# Patient Record
Sex: Male | Born: 2000 | Race: Black or African American | Hispanic: No | Marital: Single | Smoking: Never smoker
Health system: Southern US, Community
[De-identification: ages and names within clinical notes are randomized; demographics above are authoritative.]

## PROBLEM LIST (undated history)

## (undated) DIAGNOSIS — M1990 Unspecified osteoarthritis, unspecified site: Secondary | ICD-10-CM

## (undated) DIAGNOSIS — E669 Obesity, unspecified: Secondary | ICD-10-CM

## (undated) DIAGNOSIS — I10 Essential (primary) hypertension: Secondary | ICD-10-CM

---
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
## 2018-03-06 ENCOUNTER — Other Ambulatory Visit: Payer: Self-pay

## 2018-03-06 ENCOUNTER — Encounter (HOSPITAL_COMMUNITY): Payer: Self-pay

## 2018-03-06 ENCOUNTER — Ambulatory Visit (HOSPITAL_COMMUNITY)
Admission: EM | Admit: 2018-03-06 | Discharge: 2018-03-06 | Disposition: A | Discharge status: Home / Self Care | Payer: Managed Care, Other (non HMO) | Attending: Family Medicine | Admitting: Family Medicine

## 2018-03-06 DIAGNOSIS — G510 Bell's palsy: Secondary | ICD-10-CM | POA: Diagnosis not present

## 2018-03-06 HISTORY — DX: Unspecified osteoarthritis, unspecified site: M19.90

## 2018-03-06 MED ORDER — PREDNISONE 20 MG PO TABS: 60.0000 mg | ORAL_TABLET | Freq: Every day | ORAL | 0 refills | Status: AC

## 2018-03-06 NOTE — ED Triage Notes (Signed)
[facility], pt complains of left ear pain, runny nose, and numbness in face x2 days, pt has taken sudafed to treat symptoms but has no relief

## 2018-03-06 NOTE — Discharge Instructions (Signed)
History and exam concerning for Bell's palsy.  Start prednisone as directed.  Follow-up with neurology in 5 days for reevaluation and further management needed.

## 2018-03-06 NOTE — ED Provider Notes (Signed)
[facility]    CSN: [id number] Arrival date & time: 03/06/18  1408     History   Chief Complaint Chief Complaint  Patient presents with  . Sinusitis    HPI Jonathan Mooney is a 17 y.o. male.   17 year old male comes in with mother for 2-day history of left ear pain, rhinorrhea, numbness to the left side of the face.  States first started out with left ear pain about 3 days ago, thought it was due to his ear but, and removed it.  Since 2 days ago, has had rhinorrhea, facial numbness to the left side.  Denies cough, congestion, sore throat.  Denies fever, chills, night sweats.  Feels some "cramps" to the submandibular area when opening his mouth without trismus.  He still eating and drinking without problems.  Denies weakness, dizziness, syncope.  Denies ataxia, aphasia, confusion, altered mental status.  Has not tried anything for the symptoms.      Past Medical History:  Diagnosis Date  . Arthritis   . Obesity     There are no active problems to display for this patient.   History reviewed. No pertinent surgical history.     Home Medications    Prior to Admission medications   Medication Sig Start Date End Date Taking? Authorizing Provider  predniSONE (DELTASONE) 20 MG tablet Take 3 tablets (60 mg total) by mouth daily for 5 days. 03/06/18 03/11/18  Belinda FisherYu, Amy V, PA-C    Family History History reviewed. No pertinent family history.  Social History Social History   Tobacco Use  . Smoking status: Never Smoker  Substance Use Topics  . Alcohol use: No    Frequency: Never  . Drug use: No     Allergies   Patient has no known allergies.   Review of Systems Review of Systems  Reason unable to perform ROS: See HPI as above.     Physical Exam Triage Vital Signs ED Triage Vitals  Enc Vitals Group     BP 03/06/18 1447 (!) 154/86     Pulse Rate 03/06/18 1447 87     Resp 03/06/18 1447 17     Temp 03/06/18 1447 98.6 F (37 C)     Temp Source  03/06/18 1447 Oral     SpO2 03/06/18 1447 96 %     Weight --      Height --      Head Circumference --      Peak Flow --      Pain Score 03/06/18 1448 0     Pain Loc --      Pain Edu? --      Excl. in [place]? --    No data found.  Updated Vital Signs BP (!) 154/86 (BP Location: Left Arm)   Pulse 87   Temp 98.6 F (37 C) (Oral)   Resp 17   SpO2 96%   Physical Exam  Constitutional: He is oriented to person, place, and time. He appears well-developed and well-nourished. No distress.  HENT:  Head: Normocephalic and atraumatic.  Right Ear: Tympanic membrane, external ear and ear canal normal. Tympanic membrane is not erythematous and not bulging.  Left Ear: Tympanic membrane, external ear and ear canal normal. Tympanic membrane is not erythematous and not bulging.  Nose: Nose normal. Right sinus exhibits no maxillary sinus tenderness and no frontal sinus tenderness. Left sinus exhibits no maxillary sinus tenderness and no frontal sinus tenderness.  Mouth/Throat: Uvula is midline, oropharynx is clear  and moist and mucous membranes are normal. No trismus in the jaw. No tonsillar exudate.  Eyes: Pupils are equal, round, and reactive to light. Conjunctivae are normal.  Neck: Normal range of motion. Neck supple.  Cardiovascular: Normal rate, regular rhythm and normal heart sounds. Exam reveals no gallop and no friction rub.  No murmur heard. Pulmonary/Chest: Effort normal and breath sounds normal. He has no decreased breath sounds. He has no wheezes. He has no rhonchi. He has no rales.  Lymphadenopathy:    He has no cervical adenopathy.  Neurological: He is alert and oriented to person, place, and time. He is not disoriented. GCS eye subscore is 4. GCS verbal subscore is 5. GCS motor subscore is 6.  Deficit of cranial nerve VII, with inability to raise eyebrow, decreased strength of eyelid, some mouth drooping on the left side.  Cranial nerves I to VI, VIII to XII grossly intact.  Sensation  intact and equal bilaterally.  Was able to distinguish between sharp and dull.  Negative Romberg, pronator drift.  Normal finger-to-nose, heel-to-shin, rapid movement.  Normal gait and coordination, was able to ambulate on and off exam table without difficulty.  Skin: Skin is warm and dry. He is not diaphoretic.  Psychiatric: He has a normal mood and affect. His behavior is normal. Judgment normal.    UC Treatments / Results  Labs (all labs ordered are listed, but only abnormal results are displayed) Labs Reviewed - No data to display  EKG None  Radiology No results found.  Procedures Procedures (including critical care time)  Medications Ordered in UC Medications - No data to display  Initial Impression / Assessment and Plan / UC Course  I have reviewed the triage vital signs and the nursing notes.  Pertinent labs & imaging results that were available during my care of the patient were reviewed by me and considered in my medical decision making (see chart for details).    History and exam concerning for Bell's palsy.  Will start prednisone and have patient follow-up with neurology within the week.  Return precautions given.  Mother expresses understanding and agrees to plan.  Final Clinical Impressions(s) / UC Diagnoses   Final diagnoses:  Bell's palsy    ED Prescriptions    Medication Sig Dispense Auth. Provider   predniSONE (DELTASONE) 20 MG tablet Take 3 tablets (60 mg total) by mouth daily for 5 days. 15 tablet Threasa Alpha, PA-C 03/06/18 1800

## 2018-03-06 NOTE — ED Notes (Signed)
Pt discharged by provider.

## 2018-05-11 ENCOUNTER — Encounter

## 2018-05-11 ENCOUNTER — Ambulatory Visit: Payer: 59 | Admitting: Neurology

## 2018-05-11 ENCOUNTER — Encounter: Payer: Self-pay | Admitting: Neurology

## 2018-05-11 ENCOUNTER — Telehealth: Payer: Self-pay | Admitting: *Deleted

## 2018-05-11 NOTE — Telephone Encounter (Signed)
No showed new patient appointment. 

## 2018-09-28 IMAGING — MR MR KNEE*L* W/O CM
4 of 6 series · 23 of 40 positions shown · non-contrast
Comparison: Radiographs 10/13/2017

CLINICAL DATA: Patellar dislocation 3 weeks ago.

EXAM:
MRI OF THE LEFT KNEE WITHOUT CONTRAST
TECHNIQUE: Multiplanar, multisequence MR imaging of the knee was performed. No
intravenous contrast was administered.

[Series 4: pd_tse_fs_tra · axial · 4.0mm · 0.42mm/px · z∈[-72,+28]mm · 5 of 26 slices shown]
[im 1/26]
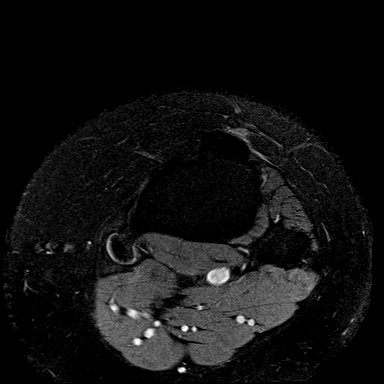
[im 4/26]
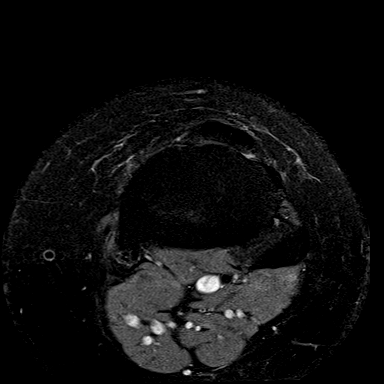
[im 7/26]
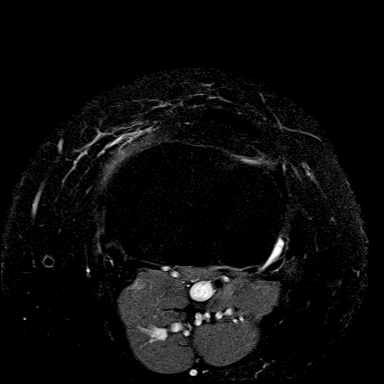
[im 13/26]
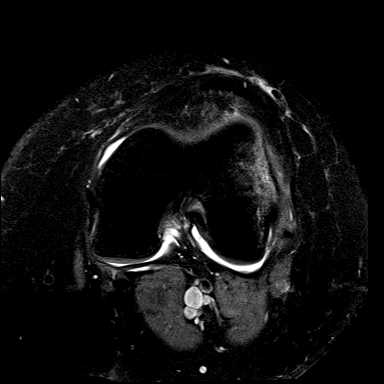
[im 22/26]
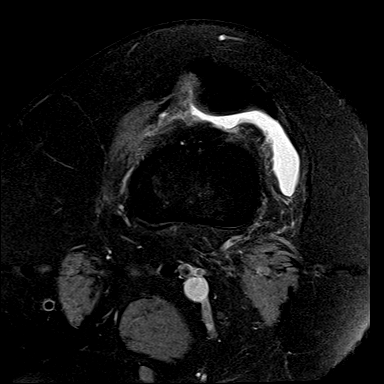

[Series 6: T2 fat-sat · coronal · 4.0mm · 0.62mm/px · 6 of 22 slices shown]
[im 1/22]
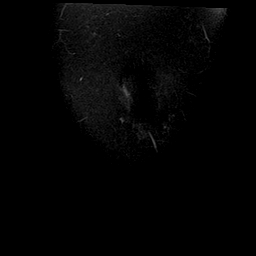
[im 5/22]
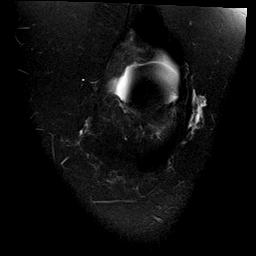
[im 9/22]
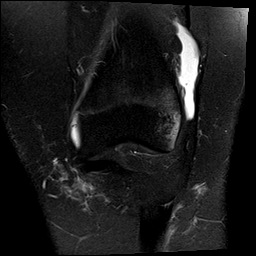
[im 13/22]
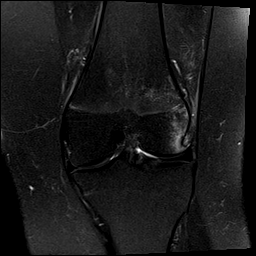
[im 17/22]
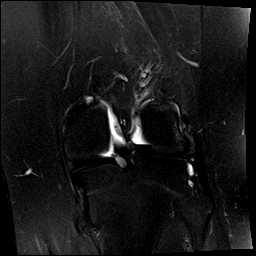
[im 22/22]
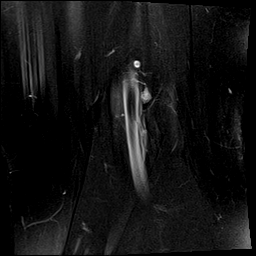

[Series 7: PD fat-sat · sagittal · 4.0mm · 0.31mm/px · 6 of 22 slices shown]
[im 1/22]
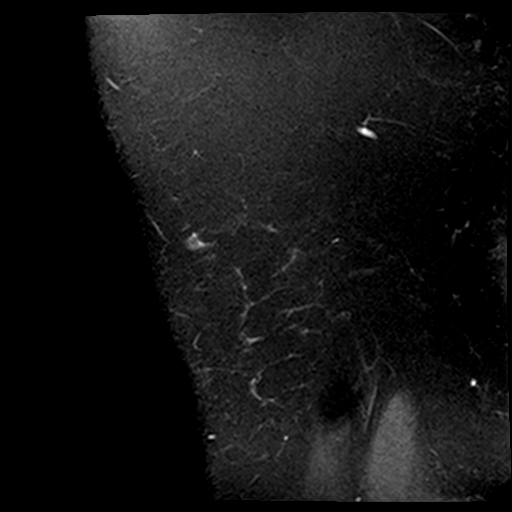
[im 5/22]
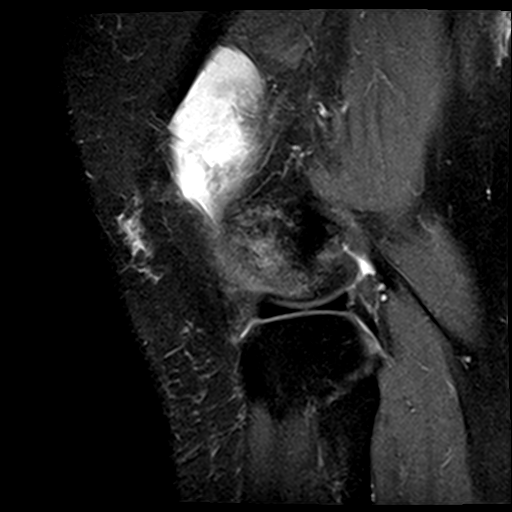
[im 9/22]
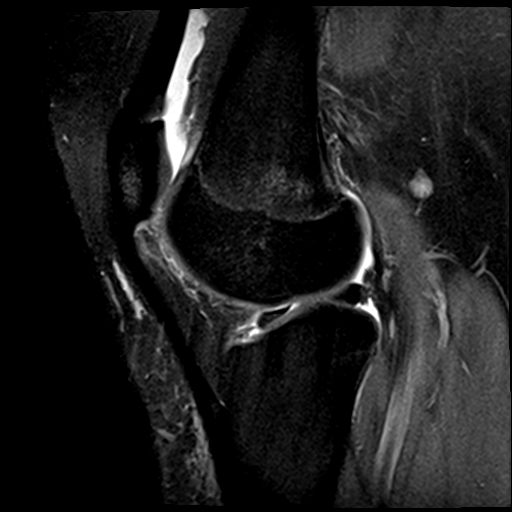
[im 13/22]
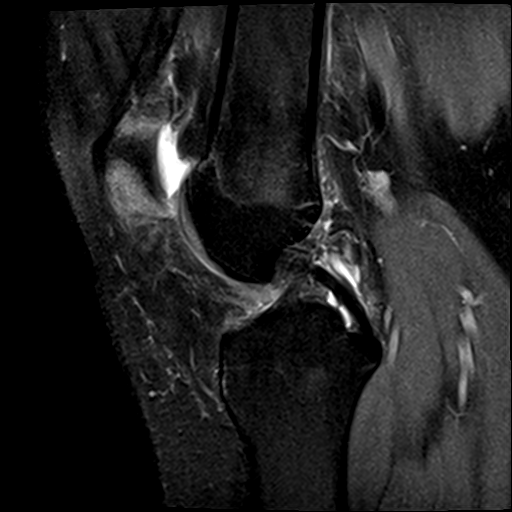
[im 17/22]
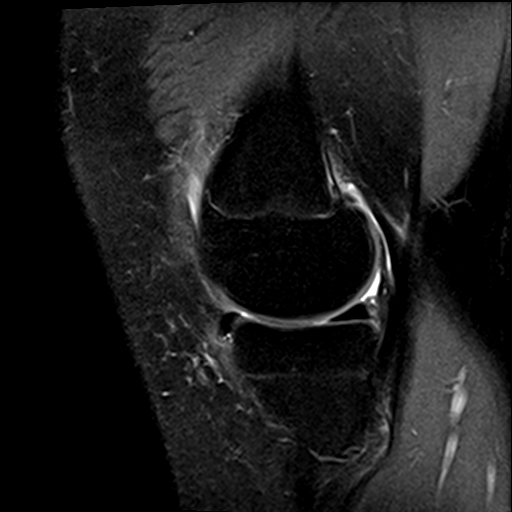
[im 22/22]
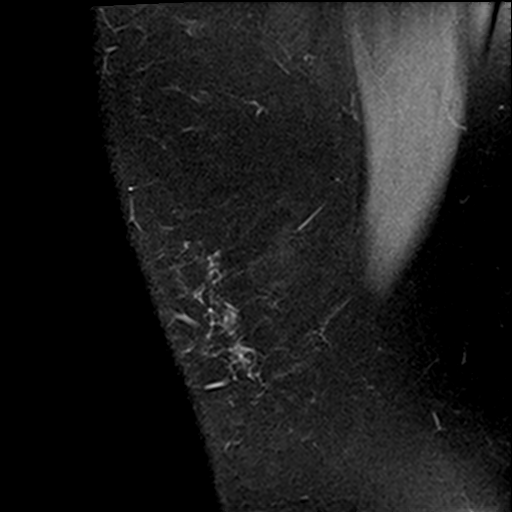

[Series 8: T1 · coronal · 4.0mm · 0.25mm/px · 6 of 22 slices shown]
[im 1/22]
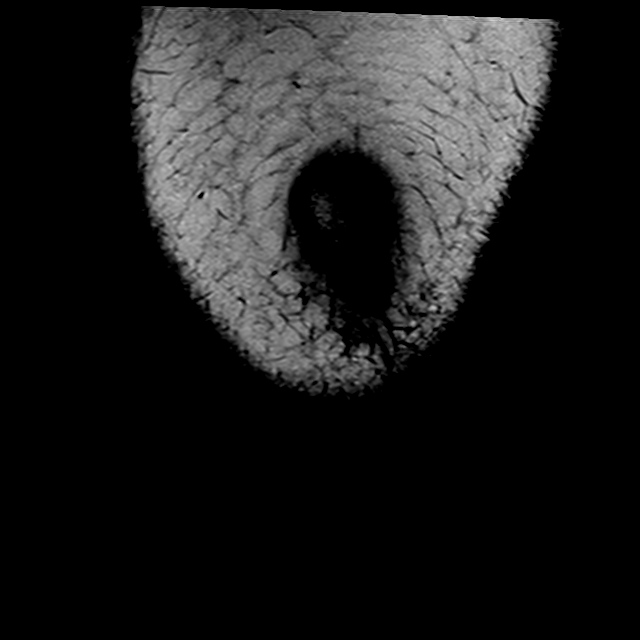
[im 5/22]
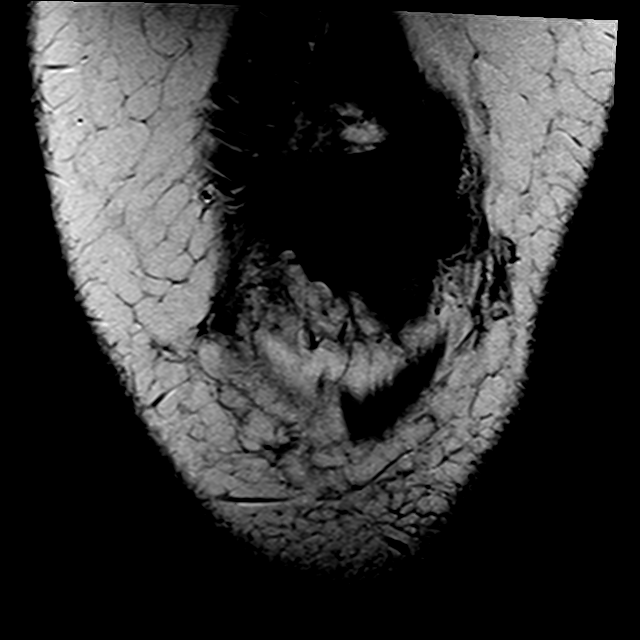
[im 9/22]
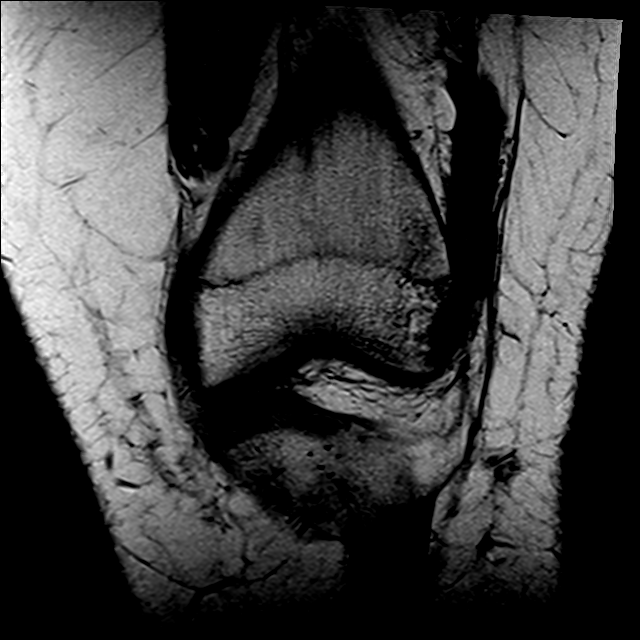
[im 13/22]
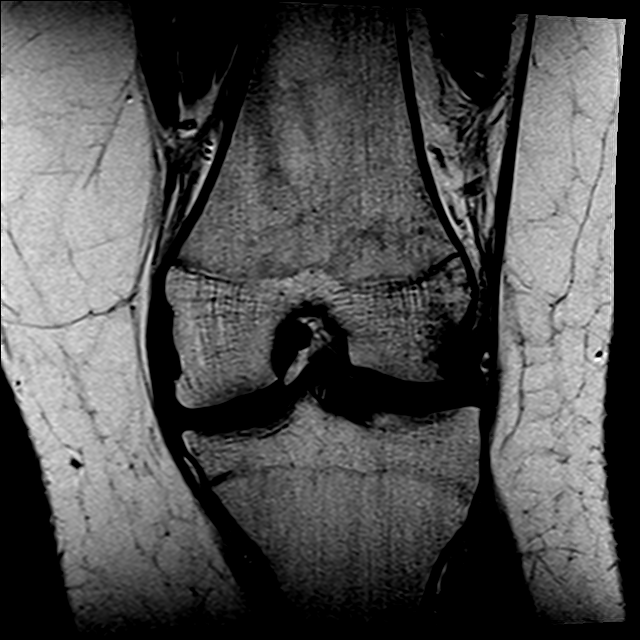
[im 17/22]
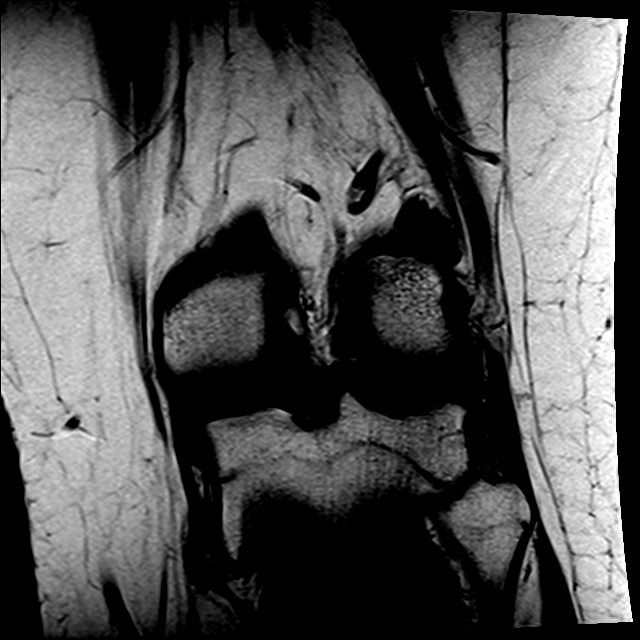
[im 22/22]
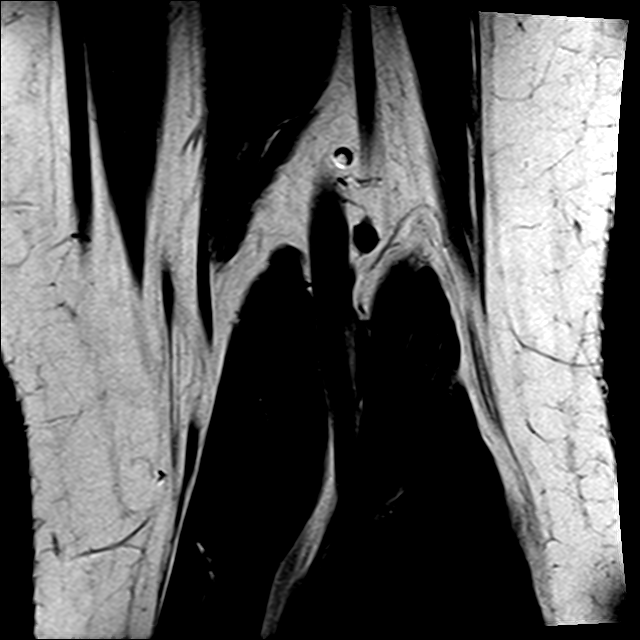

[23 of 40 positions shown; findings below may reference images not displayed]

FINDINGS: MENISCI

Medial meniscus:  Intact

Lateral meniscus:  Intact

LIGAMENTS

Cruciates:  Intact

Collaterals:  Intact

CARTILAGE

Patellofemoral: Intact articular cartilage. Chondral contusion
involving the medial facet. No delamination injury.

Medial:  Normal

Lateral:  Normal

Joint:  Moderate-sized joint effusion.

Popliteal Fossa:  No popliteal mass or Baker's cyst.

Extensor Mechanism: Findings consistent with a patellar dislocation
injury. The medial retinaculum and medial patellofemoral ligament
are torn at the patellar attachment site. The quadriceps and
patellar tendons are intact. The TT-TG distance is 16 mm.

Bones: Typical kissing bone contusions involving the medial patella
and lateral femoral condyle.

Other: Normal knee musculature.
IMPRESSION: 1. Findings consistent with a patellar dislocation with kissing
medial patellar and lateral femoral bone contusions, torn medial
retinaculum and medial patellofemoral ligament at the patellar
attachment site and mild lateral orientation of the patella in
relation to the femoral trochlear groove.
2. Intact cruciate and collateral ligaments and no meniscal tears.
3. Small joint effusion.

## 2019-05-04 ENCOUNTER — Other Ambulatory Visit: Payer: Self-pay

## 2019-05-04 ENCOUNTER — Encounter: Payer: Self-pay | Admitting: Emergency Medicine

## 2019-05-04 ENCOUNTER — Ambulatory Visit
Admission: EM | Admit: 2019-05-04 | Discharge: 2019-05-04 | Disposition: A | Discharge status: Home / Self Care | Payer: Managed Care, Other (non HMO) | Attending: Emergency Medicine | Admitting: Emergency Medicine

## 2019-05-04 DIAGNOSIS — Z20822 Contact with and (suspected) exposure to covid-19: Secondary | ICD-10-CM

## 2019-05-04 DIAGNOSIS — Z20828 Contact with and (suspected) exposure to other viral communicable diseases: Secondary | ICD-10-CM | POA: Diagnosis not present

## 2019-05-04 DIAGNOSIS — R509 Fever, unspecified: Secondary | ICD-10-CM

## 2019-05-04 NOTE — ED Provider Notes (Signed)
[facility]    CSN: [id number] Arrival date & time: 05/04/19  1754      History   Chief Complaint Chief Complaint  Patient presents with  . COVID Exposure    HPI Jonathan Mooney is a 18 y.o. male history of arthritis, obesity presenting for cover testing.  States that his mother tested positive today.  Patient had general URI symptoms about 3 weeks ago consistent with common cold.  States that he has had subjective warmness, "like a normal fever ".  Patient denies malaise, decreased appetite, loss of smell/taste, cough, shortness of breath.  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   Past Medical History:  Diagnosis Date  . Arthritis   . Obesity     There are no active problems to display for this patient.   History reviewed. No pertinent surgical history.     Home Medications    Prior to Admission medications   Not on File    Family History History reviewed. No pertinent family history.  Social History Social History   Tobacco Use  . Smoking status: Never Smoker  . Smokeless tobacco: Never Used  Substance Use Topics  . Alcohol use: No    Frequency: Never    Comment: socially  . Drug use: No     Allergies   Patient has no known allergies.   Review of Systems Review of Systems  Constitutional: Negative for fatigue and fever.  HENT: Negative for congestion, dental problem, ear pain, facial swelling, hearing loss, sinus pain, sore throat, trouble swallowing and voice change.   Eyes: Negative for photophobia, pain and visual disturbance.  Respiratory: Negative for cough and shortness of breath.   Cardiovascular: Negative for chest pain and palpitations.  Gastrointestinal: Negative for abdominal pain, diarrhea and vomiting.  Musculoskeletal: Negative for arthralgias and myalgias.  Skin: Negative for rash and wound.  Neurological: Negative for dizziness, speech difficulty and headaches.  All other systems reviewed and are negative.     Physical Exam Triage Vital Signs ED Triage Vitals  Enc Vitals Group     BP 05/04/19 1810 138/82     Pulse Rate 05/04/19 1810 95     Resp 05/04/19 1810 18     Temp 05/04/19 1810 99.1 F (37.3 C)     Temp Source 05/04/19 1810 Oral     SpO2 05/04/19 1810 98 %     Weight --      Height --      Head Circumference --      Peak Flow --      Pain Score 05/04/19 1811 0     Pain Loc --      Pain Edu? --      Excl. in [place]? --    No data found.  Updated Vital Signs BP 138/82 (BP Location: Left Wrist)   Pulse 95   Temp 99.1 F (37.3 C) (Oral)   Resp 18   SpO2 98%   Visual Acuity Right Eye Distance:   Left Eye Distance:   Bilateral Distance:    Right Eye Near:   Left Eye Near:    Bilateral Near:     Physical Exam Constitutional:      General: He is not in acute distress.    Appearance: He is obese. He is not ill-appearing.  HENT:     Head: Normocephalic and atraumatic.     Mouth/Throat:     Mouth: Mucous membranes are moist.     Pharynx: Oropharynx is  clear.  Eyes:     General: No scleral icterus.    Pupils: Pupils are equal, round, and reactive to light.  Cardiovascular:     Rate and Rhythm: Normal rate and regular rhythm.     Heart sounds: Normal heart sounds.  Pulmonary:     Effort: Pulmonary effort is normal. No respiratory distress.     Breath sounds: No wheezing.  Skin:    Coloration: Skin is not jaundiced or pale.  Neurological:     Mental Status: He is alert and oriented to person, place, and time.      UC Treatments / Results  Labs (all labs ordered are listed, but only abnormal results are displayed) Labs Reviewed - No data to display  EKG   Radiology No results found.  Procedures Procedures (including critical care time)  Medications Ordered in UC Medications - No data to display  Initial Impression / Assessment and Plan / UC Course  I have reviewed the triage vital signs and the nursing notes.  Pertinent labs & imaging results that  were available during my care of the patient were reviewed by me and considered in my medical decision making (see chart for details).     1.  Exposure to COVID Covid test pending, work note provided.  Return precautions discussed, patient verbalized understanding and is agreeable to plan.  Final Clinical Impressions(s) / UC Diagnoses   Final diagnoses:  Close Exposure to Covid-19 Virus  Elevated temperature     Discharge Instructions     Your COVID test is pending: Is important you quarantine at home until your results are back. You may take OTC Tylenol for fever and myalgias.  It is important to drink plenty of water throughout the day to stay hydrated. If you test positive and later develop severe fever, cough, or shortness of breath, it is recommended that you go to the ER for further evaluation.    ED Prescriptions    None     Controlled Substance Prescriptions [place] Controlled Substance Registry consulted? Not Applicable   Shea EvansHall-Potvin, , [place]PA-C 05/04/19 1830

## 2019-05-04 NOTE — ED Notes (Signed)
Patient able to ambulate independently  

## 2019-05-04 NOTE — ED Triage Notes (Signed)
Pt presents to [facility] for assessment after his mom tested positive for COVID 3 days ago.  Pt denies any symptoms at this time but states he felt like he had a cold a few days ago.  Patient lives with his mother.

## 2019-05-04 NOTE — Discharge Instructions (Addendum)
Your COVID test is pending: Is important you quarantine at home until your results are back. °You may take OTC Tylenol for fever and myalgias.  It is important to drink plenty of water throughout the day to stay hydrated. °If you test positive and later develop severe fever, cough, or shortness of breath, it is recommended that you go to the ER for further evaluation. °

## 2019-05-06 LAB — NOVEL CORONAVIRUS, NAA: SARS-CoV-2, NAA: NOT DETECTED

## 2019-07-20 ENCOUNTER — Ambulatory Visit
Admission: EM | Admit: 2019-07-20 | Discharge: 2019-07-20 | Disposition: A | Discharge status: Home / Self Care | Payer: Managed Care, Other (non HMO) | Attending: Physician Assistant | Admitting: Physician Assistant

## 2019-07-20 DIAGNOSIS — L600 Ingrowing nail: Secondary | ICD-10-CM

## 2019-07-20 MED ORDER — CEPHALEXIN 500 MG PO CAPS: 500.0000 mg | ORAL_CAPSULE | Freq: Four times a day (QID) | ORAL | 0 refills | Status: DC

## 2019-07-20 NOTE — Discharge Instructions (Signed)
Start keflex as directed. Epsom salt soaks at least 15 mins once a day. Avoid tight shoes. Follow up with podiatrist for further treatment if needed.

## 2019-07-20 NOTE — ED Triage Notes (Signed)
Pt c/o lt big toe pain x2 days, denies injury

## 2019-07-20 NOTE — ED Provider Notes (Signed)
[facility]    CSN: [id number] Arrival date & time: 07/20/19  1325      History   Chief Complaint Chief Complaint  Patient presents with   Toe Pain    HPI Jonathan Mooney is a 18 y.o. male.   18 year old male comes in for 2 day history of left great toe pain. Denies injury/trauma. Pain is around the medial nailbed. Recently started wearing new shoes. Denies tightness. Area with swelling, redness and pain. Denies spreading redness. Denies purulent drainage. Has not tried anything for the symptoms.      Past Medical History:  Diagnosis Date   Arthritis    Obesity     There are no active problems to display for this patient.   History reviewed. No pertinent surgical history.     Home Medications    Prior to Admission medications   Medication Sig Start Date End Date Taking? Authorizing Provider  cephALEXin (KEFLEX) 500 MG capsule Take 1 capsule (500 mg total) by mouth 4 (four) times daily. 07/20/19   Ok Edwards, PA-C    Family History No family history on file.  Social History Social History   Tobacco Use   Smoking status: Never Smoker   Smokeless tobacco: Never Used  Substance Use Topics   Alcohol use: No    Frequency: Never    Comment: socially   Drug use: No     Allergies   Patient has no known allergies.   Review of Systems Review of Systems  Reason unable to perform ROS: See HPI as above.     Physical Exam Triage Vital Signs ED Triage Vitals  Enc Vitals Group     BP 07/20/19 1400 136/74     Pulse Rate 07/20/19 1400 81     Resp 07/20/19 1400 16     Temp 07/20/19 1400 98.2 F (36.8 C)     Temp Source 07/20/19 1400 Oral     SpO2 07/20/19 1400 97 %     Weight --      Height --      Head Circumference --      Peak Flow --      Pain Score 07/20/19 1421 0     Pain Loc --      Pain Edu? --      Excl. in [place]? --    No data found.  Updated Vital Signs BP 136/74 (BP Location: Right Arm)    Pulse 81    Temp 98.2 F  (36.8 C) (Oral)    Resp 16    SpO2 97%   Physical Exam Constitutional:      General: He is not in acute distress.    Appearance: He is well-developed. He is not diaphoretic.  HENT:     Head: Normocephalic and atraumatic.  Eyes:     Conjunctiva/sclera: Conjunctivae normal.     Pupils: Pupils are equal, round, and reactive to light.  Pulmonary:     Effort: Pulmonary effort is normal. No respiratory distress.  Musculoskeletal:     Comments: Swelling to medial nailbed of left great toe. Erythema with minimal warmth. Area tender to palpation. No fluctuance felt. No paronychia on exam. Full ROM of toe. Strength normal. NVI.   Neurological:     Mental Status: He is alert and oriented to person, place, and time.      UC Treatments / Results  Labs (all labs ordered are listed, but only abnormal results are displayed) Labs Reviewed -  No data to display  EKG   Radiology No results found.  Procedures Procedures (including critical care time)  Medications Ordered in UC Medications - No data to display  Initial Impression / Assessment and Plan / UC Course  I have reviewed the triage vital signs and the nursing notes.  Pertinent labs & imaging results that were available during my care of the patient were reviewed by me and considered in my medical decision making (see chart for details).    Will start patient on Keflex to cover for surrounding cellulitis.  Epson salt soaks.  Avoid tight shoes.  Patient to follow-up here with podiatrist for further evaluation if symptoms not improving.  Final Clinical Impressions(s) / UC Diagnoses   Final diagnoses:  Ingrown nail of great toe of left foot   ED Prescriptions    Medication Sig Dispense Auth. Provider   cephALEXin (KEFLEX) 500 MG capsule Take 1 capsule (500 mg total) by mouth 4 (four) times daily. 28 capsule Belinda Fisher, PA-C     PDMP not reviewed this encounter.   Belinda Fisher, PA-C 07/20/19 1652

## 2019-11-10 ENCOUNTER — Encounter: Payer: Self-pay | Admitting: Internal Medicine

## 2019-11-10 ENCOUNTER — Ambulatory Visit (INDEPENDENT_AMBULATORY_CARE_PROVIDER_SITE_OTHER): Payer: Managed Care, Other (non HMO) | Admitting: Internal Medicine

## 2019-11-10 DIAGNOSIS — G8929 Other chronic pain: Secondary | ICD-10-CM | POA: Diagnosis not present

## 2019-11-10 DIAGNOSIS — Z7689 Persons encountering health services in other specified circumstances: Secondary | ICD-10-CM | POA: Diagnosis not present

## 2019-11-10 DIAGNOSIS — M25562 Pain in left knee: Secondary | ICD-10-CM

## 2019-11-10 NOTE — Progress Notes (Signed)
Virtual Visit via Telephone Note  I connected with Jonathan Mooney, on 11/10/2019 at 10:33 AM by telephone due to the COVID-19 pandemic and verified that I am speaking with the correct person using two identifiers.   Consent: I discussed the limitations, risks, security and privacy concerns of performing an evaluation and management service by telephone and the availability of in person appointments. I also discussed with the patient that there may be a patient responsible charge related to this service. The patient expressed understanding and agreed to proceed.   Location of Patient: Home   Location of Provider: Clinic    Persons participating in Telemedicine visit: Ledon Weihe [employer] Dr. Earlene Plater      History of Present Illness: Patient has a visit to establish care. No concerns. No significant PMH. Takes no medications. Was previously receiving care at [facility].   Reports he dislocated his patella in 2019. He is still having residual pain and requests referral to PT.    Past Medical History:  Diagnosis Date  . Arthritis   . Obesity    No Known Allergies  No current outpatient medications on file prior to visit.   No current facility-administered medications on file prior to visit.    Observations/Objective: NAD. Speaking clearly.  Work of breathing normal.  Alert and oriented. Mood appropriate.   Assessment and Plan: 1. Encounter to establish care  2. Chronic pain of left knee - Ambulatory referral to Physical Therapy   Follow Up Instructions: Return for an annual exam    I discussed the assessment and treatment plan with the patient. The patient was provided an opportunity to ask questions and all were answered. The patient agreed with the plan and demonstrated an understanding of the instructions.   The patient was advised to call back or seek an in-person evaluation if the symptoms worsen or if the condition fails to improve  as anticipated.     I provided 10 minutes total of non-face-to-face time during this encounter including median intraservice time, reviewing previous notes, investigations, ordering medications, medical decision making, coordinating care and patient verbalized understanding at the end of the visit.    Marcy Siren, D.O. Primary Care at [facility]  11/10/2019, 10:33 AM

## 2019-11-28 ENCOUNTER — Other Ambulatory Visit: Payer: Self-pay

## 2019-11-28 ENCOUNTER — Ambulatory Visit
Admission: EM | Admit: 2019-11-28 | Discharge: 2019-11-28 | Disposition: A | Discharge status: Home / Self Care | Payer: Managed Care, Other (non HMO) | Attending: Physician Assistant | Admitting: Physician Assistant

## 2019-11-28 DIAGNOSIS — Z20822 Contact with and (suspected) exposure to covid-19: Secondary | ICD-10-CM | POA: Diagnosis not present

## 2019-11-28 DIAGNOSIS — R05 Cough: Secondary | ICD-10-CM

## 2019-11-28 DIAGNOSIS — R0981 Nasal congestion: Secondary | ICD-10-CM | POA: Diagnosis not present

## 2019-11-28 DIAGNOSIS — R059 Cough, unspecified: Secondary | ICD-10-CM

## 2019-11-28 DIAGNOSIS — J3489 Other specified disorders of nose and nasal sinuses: Secondary | ICD-10-CM

## 2019-11-28 MED ORDER — IPRATROPIUM BROMIDE 0.06 % NA SOLN: 2.0000 | Freq: Four times a day (QID) | NASAL | 0 refills | Status: DC

## 2019-11-28 MED ORDER — ALBUTEROL SULFATE HFA 108 (90 BASE) MCG/ACT IN AERS: 1.0000 | INHALATION_SPRAY | Freq: Four times a day (QID) | RESPIRATORY_TRACT | 0 refills | Status: DC | PRN

## 2019-11-28 NOTE — ED Provider Notes (Signed)
[facility]    CSN: [id number] Arrival date & time: 11/28/19  0911      History   Chief Complaint Chief Complaint  Patient presents with  . Cough    HPI Jonathan Mooney is a 19 y.o. male.   19 year old male comes in for COVID testing after getting cough and headache when Jonathan Mooney went into work. States temp was 100.4 at work from heat lamp. States yesterday had some rhinorrhea, nasal congestion. Also noticed 2 day history of shortness of breath that is intermittent where Jonathan Mooney states "it feels like I worked out, but I didn't". Denies decrease in activity due to shortness of breath. Denies abdominal pain, nausea, vomiting, diarrhea. Denies loss of taste/smell. Never smoker. Inhaler use as a child for asthma/exercise. No obvious sick/COVID contact.      Past Medical History:  Diagnosis Date  . Obesity     There are no problems to display for this patient.   History reviewed. No pertinent surgical history.     Home Medications    Prior to Admission medications   Medication Sig Start Date End Date Taking? Authorizing Provider  albuterol (VENTOLIN HFA) 108 (90 Base) MCG/ACT inhaler Inhale 1-2 puffs into the lungs every 6 (six) hours as needed for wheezing or shortness of breath. 11/28/19   Tasia Catchings, Hamdan Toscano V, PA-C  ipratropium (ATROVENT) 0.06 % nasal spray Place 2 sprays into both nostrils 4 (four) times daily. 11/28/19   Ok Edwards, PA-C    Family History History reviewed. No pertinent family history.  Social History Social History   Tobacco Use  . Smoking status: Never Smoker  . Smokeless tobacco: Never Used  Substance Use Topics  . Alcohol use: No    Comment: socially  . Drug use: No     Allergies   Patient has no known allergies.   Review of Systems Review of Systems  Reason unable to perform ROS: See HPI as above.     Physical Exam Triage Vital Signs ED Triage Vitals [11/28/19 0929]  Enc Vitals Group     BP (!) 143/89     Pulse Rate 89     Resp  16     Temp 98.3 F (36.8 C)     Temp Source Oral     SpO2 97 %     Weight      Height      Head Circumference      Peak Flow      Pain Score 2     Pain Loc      Pain Edu?      Excl. in [place]?    No data found.  Updated Vital Signs BP (!) 143/89 (BP Location: Left Arm)   Pulse 89   Temp 98.3 F (36.8 C) (Oral)   Resp 16   SpO2 97%   Physical Exam Constitutional:      General: Jonathan Mooney is not in acute distress.    Appearance: Normal appearance. Jonathan Mooney is not ill-appearing, toxic-appearing or diaphoretic.  HENT:     Head: Normocephalic and atraumatic.     Mouth/Throat:     Mouth: Mucous membranes are moist.     Pharynx: Oropharynx is clear. Uvula midline.  Cardiovascular:     Rate and Rhythm: Normal rate and regular rhythm.     Heart sounds: Normal heart sounds. No murmur. No friction rub. No gallop.   Pulmonary:     Effort: Pulmonary effort is normal. No accessory muscle usage,  prolonged expiration, respiratory distress or retractions.     Comments: Lungs clear to auscultation without adventitious lung sounds. Musculoskeletal:     Cervical back: Normal range of motion and neck supple.  Skin:    General: Skin is warm and dry.  Neurological:     General: No focal deficit present.     Mental Status: Jonathan Mooney is alert and oriented to person, place, and time.     UC Treatments / Results  Labs (all labs ordered are listed, but only abnormal results are displayed) Labs Reviewed  NOVEL CORONAVIRUS, NAA    EKG   Radiology No results found.  Procedures Procedures (including critical care time)  Medications Ordered in UC Medications - No data to display  Initial Impression / Assessment and Plan / UC Course  I have reviewed the triage vital signs and the nursing notes.  Pertinent labs & imaging results that were available during my care of the patient were reviewed by me and considered in my medical decision making (see chart for details).    COVID PCR test ordered. Patient  to quarantine until testing results return. No alarming signs on exam.  Patient speaking in full sentences without respiratory distress. Discussed given symptom onset <24 hours, may need retesting if symptoms worsen or change. Symptomatic treatment discussed.  Push fluids.  Return precautions given.  Patient expresses understanding and agrees to plan.  Final Clinical Impressions(s) / UC Diagnoses   Final diagnoses:  Cough  Rhinorrhea  Nasal congestion   ED Prescriptions    Medication Sig Dispense Auth. Provider   albuterol (VENTOLIN HFA) 108 (90 Base) MCG/ACT inhaler Inhale 1-2 puffs into the lungs every 6 (six) hours as needed for wheezing or shortness of breath. 8 g Epsie Walthall V, PA-C   ipratropium (ATROVENT) 0.06 % nasal spray Place 2 sprays into both nostrils 4 (four) times daily. 15 mL Belinda Fisher, PA-C     PDMP not reviewed this encounter.   Belinda Fisher, PA-C 11/28/19 [phone]

## 2019-11-28 NOTE — Discharge Instructions (Addendum)
COVID PCR testing ordered. I would like you to quarantine until testing results. Atrovent for nasal drainage. You can try albuterol for shortness of breath. Tylenol/motrin for pain and fever. Keep hydrated, urine should be clear to pale yellow in color. If experiencing worsening shortness of breath, trouble breathing, go to the emergency department for further evaluation needed.   As discussed, given symptoms started today, if you have worsening or more symptoms, may need retesting if results come back negative.

## 2019-11-28 NOTE — ED Triage Notes (Signed)
Pt requesting COVID test for work. States went in to work at [employer], started coughing with a slight headache. States at work his temp was 100.4.

## 2019-11-29 LAB — NOVEL CORONAVIRUS, NAA: SARS-CoV-2, NAA: NOT DETECTED

## 2019-12-25 ENCOUNTER — Telehealth: Payer: Self-pay

## 2019-12-25 ENCOUNTER — Encounter: Payer: Self-pay | Admitting: Internal Medicine

## 2019-12-25 NOTE — Patient Instructions (Signed)

## 2019-12-25 NOTE — Telephone Encounter (Signed)
Called patient to do their pre-visit COVID screening.  Call went to voicemail. Unable to do prescreening.  

## 2019-12-26 ENCOUNTER — Other Ambulatory Visit: Payer: Self-pay

## 2019-12-26 ENCOUNTER — Encounter: Payer: Self-pay | Admitting: Internal Medicine

## 2019-12-26 ENCOUNTER — Ambulatory Visit (INDEPENDENT_AMBULATORY_CARE_PROVIDER_SITE_OTHER): Payer: Managed Care, Other (non HMO) | Admitting: Internal Medicine

## 2019-12-26 VITALS — BP 120/83 | HR 94 | Temp 97.3°F | Resp 18 | Ht 66.0 in | Wt 356.0 lb

## 2019-12-26 DIAGNOSIS — Z13228 Encounter for screening for other metabolic disorders: Secondary | ICD-10-CM

## 2019-12-26 DIAGNOSIS — Z131 Encounter for screening for diabetes mellitus: Secondary | ICD-10-CM

## 2019-12-26 DIAGNOSIS — Z0000 Encounter for general adult medical examination without abnormal findings: Secondary | ICD-10-CM

## 2019-12-26 NOTE — Progress Notes (Signed)
  Subjective:    VALDEMAR MCCLENAHAN - 19 y.o. male MRN [id number]  Date of birth: [birth year]  HPI  MONROE QIN is here for annual exam. Patient has no concerns today. Declines STD testing.      Health Maintenance:  Health Maintenance Due  Topic Date Due  . HIV Screening  Never done  . TETANUS/TDAP  Never done    -  reports that he has never smoked. He has never used smokeless tobacco. - Review of Systems: Per HPI. - Past Medical History: There are no problems to display for this patient.  - Medications: reviewed and updated   Objective:   Physical Exam BP 120/83   Pulse 94   Temp (!) 97.3 F (36.3 C) (Temporal)   Resp 18   Ht 5\' 6"  (1.676 m)   Wt (!) 356 lb (161.5 kg)   SpO2 96%   BMI 57.46 kg/m  Physical Exam  Constitutional: He is oriented to person, place, and time and well-developed, well-nourished, and in no distress.  HENT:  Head: Normocephalic and atraumatic.  Mouth/Throat: Oropharynx is clear and moist.  Eyes: Pupils are equal, round, and reactive to light. Conjunctivae and EOM are normal.  Neck: No thyromegaly present.  Cardiovascular: Normal rate, regular rhythm, normal heart sounds and intact distal pulses.  No murmur heard. Pulmonary/Chest: Effort normal and breath sounds normal. No respiratory distress. He has no wheezes.  Abdominal: Soft. Bowel sounds are normal. He exhibits no distension. There is no abdominal tenderness. There is no rebound and no guarding.  Musculoskeletal:        General: No deformity or edema. Normal range of motion.     Cervical back: Normal range of motion and neck supple.  Lymphadenopathy:    He has no cervical adenopathy.  Neurological: He is alert and oriented to person, place, and time. Gait normal.  Skin: Skin is warm and dry. No rash noted. He is not diaphoretic.  Psychiatric: Mood, affect and judgment normal.      Assessment & Plan:    1. Annual physical exam Counseled on 150 minutes of exercise per week,  healthy eating (including decreased daily intake of saturated fats, cholesterol, added sugars, sodium), STI prevention, routine healthcare maintenance. - CBC with Differential - Comprehensive metabolic panel - Lipid Panel  2. Screening for diabetes mellitus (DM) - Hemoglobin A1c  3. Screening for metabolic disorder - Lipid Panel     , D.O. 12/26/2019, 8:48 AM Primary Care at [facility]

## 2019-12-27 LAB — CBC WITH DIFFERENTIAL/PLATELET
Basophils Absolute: 0.1 10*3/uL (ref 0.0–0.2)
Basos: 1 %
EOS (ABSOLUTE): 0.1 10*3/uL (ref 0.0–0.4)
Eos: 1 %
Hematocrit: 43.8 % (ref 37.5–51.0)
Hemoglobin: 14.9 g/dL (ref 13.0–17.7)
Immature Grans (Abs): 0 10*3/uL (ref 0.0–0.1)
Immature Granulocytes: 0 %
Lymphocytes Absolute: 3.7 10*3/uL — ABNORMAL HIGH (ref 0.7–3.1)
Lymphs: 31 %
MCH: 30 pg (ref 26.6–33.0)
MCHC: 34 g/dL (ref 31.5–35.7)
MCV: 88 fL (ref 79–97)
Monocytes Absolute: 1.1 10*3/uL — ABNORMAL HIGH (ref 0.1–0.9)
Monocytes: 10 %
Neutrophils Absolute: 6.8 10*3/uL (ref 1.4–7.0)
Neutrophils: 57 %
Platelets: 258 10*3/uL (ref 150–450)
RBC: 4.97 x10E6/uL (ref 4.14–5.80)
RDW: 13.3 % (ref 11.6–15.4)
WBC: 11.8 10*3/uL — ABNORMAL HIGH (ref 3.4–10.8)

## 2019-12-27 LAB — COMPREHENSIVE METABOLIC PANEL
ALT: 31 IU/L (ref 0–44)
AST: 22 IU/L (ref 0–40)
Albumin/Globulin Ratio: 1.9 (ref 1.2–2.2)
Albumin: 4.4 g/dL (ref 4.1–5.2)
Alkaline Phosphatase: 133 IU/L — ABNORMAL HIGH (ref 39–117)
BUN/Creatinine Ratio: 12 (ref 9–20)
BUN: 9 mg/dL (ref 6–20)
Bilirubin Total: 0.4 mg/dL (ref 0.0–1.2)
CO2: 23 mmol/L (ref 20–29)
Calcium: 9.5 mg/dL (ref 8.7–10.2)
Chloride: 104 mmol/L (ref 96–106)
Creatinine, Ser: 0.74 mg/dL — ABNORMAL LOW (ref 0.76–1.27)
GFR calc Af Amer: 154 mL/min/{1.73_m2} (ref >59)
GFR calc non Af Amer: 133 mL/min/{1.73_m2} (ref >59)
Globulin, Total: 2.3 g/dL (ref 1.5–4.5)
Glucose: 92 mg/dL (ref 65–99)
Potassium: 4.3 mmol/L (ref 3.5–5.2)
Sodium: 140 mmol/L (ref 134–144)
Total Protein: 6.7 g/dL (ref 6.0–8.5)

## 2019-12-27 LAB — LIPID PANEL
Chol/HDL Ratio: 5.8 ratio — ABNORMAL HIGH (ref 0.0–5.0)
Cholesterol, Total: 225 mg/dL — ABNORMAL HIGH (ref 100–199)
HDL: 39 mg/dL — ABNORMAL LOW (ref >39)
LDL Chol Calc (NIH): 163 mg/dL — ABNORMAL HIGH (ref 0–99)
Triglycerides: 126 mg/dL (ref 0–149)
VLDL Cholesterol Cal: 23 mg/dL (ref 5–40)

## 2019-12-27 LAB — HEMOGLOBIN A1C
Est. average glucose Bld gHb Est-mCnc: 114 mg/dL
Hgb A1c MFr Bld: 5.6 % (ref 4.8–5.6)

## 2020-01-08 NOTE — Progress Notes (Signed)
Patient notified of results & recommendations. Expressed understanding.

## 2020-04-30 ENCOUNTER — Ambulatory Visit
Admission: EM | Admit: 2020-04-30 | Discharge: 2020-04-30 | Disposition: A | Discharge status: Home / Self Care | Payer: Managed Care, Other (non HMO) | Attending: Physician Assistant | Admitting: Physician Assistant

## 2020-04-30 DIAGNOSIS — Z1152 Encounter for screening for COVID-19: Secondary | ICD-10-CM | POA: Diagnosis not present

## 2020-04-30 NOTE — ED Triage Notes (Signed)
Pt states needs covid testing for work d/t positive exposure to the company not pt directly. States is covid vaccinated. Denies s/sx's.

## 2020-04-30 NOTE — Discharge Instructions (Signed)

## 2020-05-01 LAB — SARS-COV-2, NAA 2 DAY TAT

## 2020-05-01 LAB — NOVEL CORONAVIRUS, NAA: SARS-CoV-2, NAA: NOT DETECTED

## 2020-06-25 ENCOUNTER — Telehealth (INDEPENDENT_AMBULATORY_CARE_PROVIDER_SITE_OTHER): Payer: Managed Care, Other (non HMO) | Admitting: Internal Medicine

## 2020-06-25 ENCOUNTER — Other Ambulatory Visit: Payer: Self-pay

## 2020-06-25 ENCOUNTER — Encounter: Payer: Self-pay | Admitting: Internal Medicine

## 2020-06-25 DIAGNOSIS — Z6841 Body Mass Index (BMI) 40.0 and over, adult: Secondary | ICD-10-CM | POA: Diagnosis not present

## 2020-06-25 NOTE — Progress Notes (Signed)
Virtual Visit via Telephone Note  I connected with Jonathan Mooney, on 06/25/2020 at 8:38 AM by telephone due to the COVID-19 pandemic and verified that I am speaking with the correct person using two identifiers.   Consent: I discussed the limitations, risks, security and privacy concerns of performing an evaluation and management service by telephone and the availability of in person appointments. I also discussed with the patient that there may be a patient responsible charge related to this service. The patient expressed understanding and agreed to proceed.   Location of Patient: Home   Location of Provider: Clinic    Persons participating in Telemedicine visit: [place] [facility] Dr. Earlene Plater      History of Present Illness: Patient has a follow up obesity. Reports that he has started waking up early. Going to work now and says his new job requires a lot of physical activity. Walks multiple times per week. Has started cutting back on portions. Now only drinking soda 2x per day instead of multiple times throughout the day. Does not have a scale at home but was previously a 6x and is now a 4x.    Past Medical History:  Diagnosis Date   Obesity    No Known Allergies  No current outpatient medications on file prior to visit.   No current facility-administered medications on file prior to visit.    Observations/Objective: NAD. Speaking clearly.  Work of breathing normal.  Alert and oriented. Mood appropriate.   Assessment and Plan: 1. Morbid obesity with BMI of 50.0-59.9, adult (HCC) Has been able to make lifestyle changes that presumably have resulted in weight loss given change in clothing size over the past couple months. Will refer to nutritionist for additional counseling in healthy food choices and portions. Encouraged to continue active lifestyle. Discussed that weight loss will improve long term health outcomes.  - Amb ref to Medical Nutrition  Therapy-MNT   Follow Up Instructions: 3 f/u obesity    I discussed the assessment and treatment plan with the patient. The patient was provided an opportunity to ask questions and all were answered. The patient agreed with the plan and demonstrated an understanding of the instructions.   The patient was advised to call back or seek an in-person evaluation if the symptoms worsen or if the condition fails to improve as anticipated.     I provided 14 minutes total of non-face-to-face time during this encounter including median intraservice time, reviewing previous notes, investigations, ordering medications, medical decision making, coordinating care and patient verbalized understanding at the end of the visit.    Marcy Siren, D.O. Primary Care at [facility]  06/25/2020, 8:38 AM

## 2020-10-11 ENCOUNTER — Encounter: Payer: Self-pay | Admitting: Emergency Medicine

## 2020-10-11 ENCOUNTER — Ambulatory Visit
Admission: EM | Admit: 2020-10-11 | Discharge: 2020-10-11 | Disposition: A | Discharge status: Home / Self Care | Payer: Managed Care, Other (non HMO)

## 2020-10-11 DIAGNOSIS — L600 Ingrowing nail: Secondary | ICD-10-CM | POA: Diagnosis not present

## 2020-10-11 NOTE — ED Provider Notes (Signed)
[facility]    CSN: [id number] Arrival date & time: 10/11/20  0951      History   Chief Complaint Chief Complaint  Patient presents with  . Toe Injury    HPI Jonathan Mooney is a 20 y.o. male  With history as below presenting for right great toe pain and bleeding. States is been ongoing for the last few days: Denies injury. States he has history of ingrown toenail. Requesting note for work as he wears steel toe boots which aggravate it. Has been cleaning with peroxide, Vaseline. Has never seen podiatrist, nor has he had toenail removal. Denies fever.  Past Medical History:  Diagnosis Date  . Obesity     There are no problems to display for this patient.   History reviewed. No pertinent surgical history.     Home Medications    Prior to Admission medications   Not on File    Family History Family History  Problem Relation Age of Onset  . Healthy Mother   . Healthy Father     Social History Social History   Tobacco Use  . Smoking status: Never Smoker  . Smokeless tobacco: Never Used  Vaping Use  . Vaping Use: Never used  Substance Use Topics  . Alcohol use: No    Comment: socially  . Drug use: No     Allergies   Patient has no known allergies.   Review of Systems As per HPI   Physical Exam Triage Vital Signs ED Triage Vitals  Enc Vitals Group     BP 10/11/20 1025 (!) 165/88     Pulse Rate 10/11/20 1025 81     Resp 10/11/20 1025 18     Temp 10/11/20 1025 98.2 F (36.8 C)     Temp Source 10/11/20 1025 Oral     SpO2 10/11/20 1025 97 %     Weight --      Height --      Head Circumference --      Peak Flow --      Pain Score 10/11/20 1024 0     Pain Loc --      Pain Edu? --      Excl. in [place]? --    No data found.  Updated Vital Signs BP (!) 165/88 (BP Location: Right Arm)   Pulse 81   Temp 98.2 F (36.8 C) (Oral)   Resp 18   SpO2 97%   Visual Acuity Right Eye Distance:   Left Eye Distance:   Bilateral  Distance:    Right Eye Near:   Left Eye Near:    Bilateral Near:     Physical Exam Constitutional:      General: He is not in acute distress. HENT:     Head: Normocephalic and atraumatic.  Eyes:     General: No scleral icterus.    Pupils: Pupils are equal, round, and reactive to light.  Cardiovascular:     Rate and Rhythm: Normal rate.  Pulmonary:     Effort: Pulmonary effort is normal. No respiratory distress.     Breath sounds: No wheezing.  Skin:    Coloration: Skin is not jaundiced or pale.     Comments: Mild ingrown toenail of R great toe, lateral aspect. No active bleeding, discharge, tenderness, swelling, fluctuance  Neurological:     Mental Status: He is alert and oriented to person, place, and time.      UC Treatments / Results  Labs (all  labs ordered are listed, but only abnormal results are displayed) Labs Reviewed - No data to display  EKG   Radiology No results found.  Procedures Procedures (including critical care time)  Medications Ordered in UC Medications - No data to display  Initial Impression / Assessment and Plan / UC Course  I have reviewed the triage vital signs and the nursing notes.  Pertinent labs & imaging results that were available during my care of the patient were reviewed by me and considered in my medical decision making (see chart for details).     Ingrown toenail without infection. Will treat supportively as below, provided work note, follow-up with podiatry.  Return precautions discussed, pt verbalized understanding and is agreeable to plan. Final Clinical Impressions(s) / UC Diagnoses   Final diagnoses:  Ingrown toenail without infection   Discharge Instructions   None    ED Prescriptions    None     PDMP not reviewed this encounter.   Hall[place], [place], [place] 10/11/20 1137

## 2020-10-11 NOTE — ED Triage Notes (Signed)
Pt states that his right big toe has been bleeding for 2-3 days. Pt states that he tried cleaning with peroxide and Vaseline and wrapping it up for at home treatment.

## 2021-05-07 DIAGNOSIS — F5104 Psychophysiologic insomnia: Secondary | ICD-10-CM | POA: Insufficient documentation

## 2021-05-07 DIAGNOSIS — I10 Essential (primary) hypertension: Secondary | ICD-10-CM | POA: Insufficient documentation

## 2021-05-07 DIAGNOSIS — E785 Hyperlipidemia, unspecified: Secondary | ICD-10-CM | POA: Insufficient documentation

## 2021-05-07 DIAGNOSIS — Z6841 Body Mass Index (BMI) 40.0 and over, adult: Secondary | ICD-10-CM | POA: Insufficient documentation

## 2021-11-17 ENCOUNTER — Other Ambulatory Visit: Payer: Self-pay

## 2021-11-17 ENCOUNTER — Ambulatory Visit (INDEPENDENT_AMBULATORY_CARE_PROVIDER_SITE_OTHER): Payer: Managed Care, Other (non HMO) | Admitting: Podiatry

## 2021-11-17 ENCOUNTER — Encounter: Payer: Self-pay | Admitting: Podiatry

## 2021-11-17 ENCOUNTER — Other Ambulatory Visit: Payer: Self-pay | Admitting: Podiatry

## 2021-11-17 DIAGNOSIS — L03031 Cellulitis of right toe: Secondary | ICD-10-CM | POA: Diagnosis not present

## 2021-11-17 DIAGNOSIS — L02612 Cutaneous abscess of left foot: Secondary | ICD-10-CM

## 2021-11-17 DIAGNOSIS — L03032 Cellulitis of left toe: Secondary | ICD-10-CM | POA: Diagnosis not present

## 2021-11-17 NOTE — Patient Instructions (Signed)

## 2021-11-18 ENCOUNTER — Telehealth: Payer: Self-pay | Admitting: Podiatry

## 2021-11-18 NOTE — Telephone Encounter (Signed)
Called patient back and let him know that he can use OTC triple antibiotic ointment on his toe.

## 2021-11-18 NOTE — Telephone Encounter (Signed)
Patient called wants to know what ointment he is suppose to be applying to his toe?  Is it an OTC or does he need prescription? Please advise

## 2021-11-20 LAB — WOUND CULTURE
MICRO NUMBER:: 13092242
SPECIMEN QUALITY:: ADEQUATE

## 2021-11-20 LAB — CLIENT EDUCATION TRACKING

## 2021-11-20 NOTE — Progress Notes (Signed)
Subjective:  ? ?Patient ID: Jonathan Mooney, male   DOB: 21 y.o.   MRN: [id number]  ? ?HPI ?21 year old male presents the office today for concerns of ingrown toenails to his left medial, right lateral nail borders which has been on for the last 4 weeks.  Went to urgent care last week and was prescribed cephalexin and has been soaking.  He has noticed some bloody drainage from the right big toe.  No current purulence.  No red streaks.  No fevers or chills.  No other concerns. ? ? ?Review of Systems  ?All other systems reviewed and are negative. ? ?Past Medical History:  ?Diagnosis Date  ? Obesity   ? ? ?No past surgical history on file. ? ?No current outpatient medications on file. ? ?No Known Allergies ? ? ? ?   ?Objective:  ?Physical Exam  ?General: AAO x3, NAD ? ?Dermatological: Incurvation present mostly to the left medial nail borders.  There is localized edema present with tenderness outpatient.  Financial evaluation is no purulence but see procedure note below with nurse purulence identified, abscess left side worse than right.  Mild incurvation of the other nail borders of the hallux but not causing pain or signs of infection at this time. ? ?Vascular: Dorsalis Pedis artery and Posterior Tibial artery pedal pulses are 2/4 bilateral with immedate capillary fill time.  There is no pain with calf compression, swelling, warmth, erythema.  ? ?Neruologic: Grossly intact via light touch bilateral.  ? ?Musculoskeletal: Tenderness to palpation with ingrown toenails but no other areas of discomfort.  ? Muscular strength 5/5 in all groups tested bilateral. ? ?Gait: Unassisted, Nonantalgic.  ? ? ?   ?Assessment:  ? ?Ingrown toenail, paronychia bilaterally ? ?   ?Plan:  ?-Treatment options discussed including all alternatives, risks, and complications ?-Etiology of symptoms were discussed ?-Given his ongoing nature of symptoms of pain discussed partial nail avulsion with chemical matricectomy.  We discussed the  procedure as well as postoperative course including all alternatives, risks, complications.  After discussion he wishes to proceed and consent was signed.  Skin was prepped with alcohol and initially 3 cc of lidocaine, Marcaine plain was infiltrated in a digital block fashion.  Bilateral hallux were prepped with Betadine and tourniquet applied.  Timeout was performed.  He still had discomfort of the left side so I did utilizing initially an additional 1 cc of lidocaine, Marcaine plain.  He still felt this and the tourniquet was removed and additional 1 cc was infiltrated.  Anesthesia was felt to be adequate and tourniquet was reapplied after the skin was prepped with Betadine.  Upon initial evaluation of the left medial nail border there was found to be purulence coming from the nail border.  This was cultured and sent to pathology.  I remove the medial nail border of the left hallux.  Attention was directed on the right side only small amount of purulence noted on the side of the lateral nail border.,  The lateral nail border without any complications.  Once the nail borders removed it was irrigated and the underlying nailbed appeared to be intact.  No further purulence.  Given the infection I did not proceed with chemical matricectomy.  Silvadene was applied followed by dressing.  Tourniquet was released there was found to be immediate cap refill time to the digits.  Tolerated well. ?-Finish course of antibiotics. ? ?Vivi Barrack DPM ? ?   ? ?

## 2021-11-21 ENCOUNTER — Other Ambulatory Visit: Payer: Self-pay | Admitting: Podiatry

## 2021-11-21 MED ORDER — DOXYCYCLINE HYCLATE 100 MG PO TABS: 100.0000 mg | ORAL_TABLET | Freq: Two times a day (BID) | ORAL | 0 refills | Status: AC

## 2021-12-01 ENCOUNTER — Other Ambulatory Visit: Payer: Self-pay

## 2021-12-01 ENCOUNTER — Ambulatory Visit (INDEPENDENT_AMBULATORY_CARE_PROVIDER_SITE_OTHER): Payer: Managed Care, Other (non HMO) | Admitting: Podiatry

## 2021-12-01 ENCOUNTER — Encounter: Payer: Self-pay | Admitting: Podiatry

## 2021-12-01 DIAGNOSIS — L03032 Cellulitis of left toe: Secondary | ICD-10-CM

## 2021-12-01 DIAGNOSIS — L03031 Cellulitis of right toe: Secondary | ICD-10-CM | POA: Diagnosis not present

## 2021-12-01 NOTE — Patient Instructions (Signed)

## 2021-12-04 NOTE — Progress Notes (Signed)
Subjective: ?21 year old male presents the office today for follow evaluation after undergoing partial nail avulsions to both of his big toes.  He states that overall he is doing well.  He has no significant pain.  No drainage or pus that he reports.  Some minimal swelling.  No redness or red streaks.  No fevers or chills.  No other concerns. ? ?Objective: ?AAO x3, NAD ?DP/PT pulses palpable bilaterally, CRT less than 3 seconds ?Status post bilateral partial nail avulsions.  Small to granulation tissue still evident.  Scabbing is formed.  There is trace edema.  No erythema or warmth.  There is no drainage or pus.  There is no fluctuation or crepitation.  There is no malodor. ?No pain with calf compression, swelling, warmth, erythema ? ?Assessment: ?Status post partial nail avulsion ? ?Plan: ?-All treatment options discussed with the patient including all alternatives, risks, complications.  ?-Recommend continue soaking Epsom salts twice a day cover with antibiotic ointment and a bandage.  He will leave it open at nighttime.  Monitor for any signs or symptoms of infection. ?-Patient encouraged to call the office with any questions, concerns, change in symptoms.  ? ?Vivi Barrack DPM ? ?

## 2022-04-19 ENCOUNTER — Ambulatory Visit
Admission: EM | Admit: 2022-04-19 | Discharge: 2022-04-19 | Disposition: A | Discharge status: Home / Self Care | Payer: Managed Care, Other (non HMO) | Attending: Physician Assistant | Admitting: Physician Assistant

## 2022-04-19 DIAGNOSIS — J069 Acute upper respiratory infection, unspecified: Secondary | ICD-10-CM

## 2022-04-19 NOTE — ED Provider Notes (Signed)
[facility]    CSN: [id number] Arrival date & time: 04/19/22  1046      History   Chief Complaint Chief Complaint  Patient presents with   URI    HPI Jonathan Mooney is a 21 y.o. male.   Patient here today for evaluation of headache, sore throat, nonproductive cough and nasal congestion that started 2 to 3 days ago.  He reports that initially he had sore throat but this is improved somewhat.  He does report some bilateral ear discomfort.  He has felt warm to touch but is not sure about fever.  He has tried warm tea which did help with his throat.  The history is provided by the patient.    Past Medical History:  Diagnosis Date   Obesity     There are no problems to display for this patient.   History reviewed. No pertinent surgical history.     Home Medications    Prior to Admission medications   Medication Sig Start Date End Date Taking? Authorizing Provider  doxycycline (VIBRA-TABS) 100 MG tablet Take 1 tablet (100 mg total) by mouth 2 (two) times daily. 11/21/21   Vivi Barrack, DPM    Family History Family History  Problem Relation Age of Onset   Healthy Mother    Healthy Father     Social History Social History   Tobacco Use   Smoking status: Never   Smokeless tobacco: Never  Vaping Use   Vaping Use: Never used  Substance Use Topics   Alcohol use: No    Comment: socially   Drug use: No     Allergies   Patient has no known allergies.   Review of Systems Review of Systems  Constitutional:  Negative for chills and fever.  HENT:  Positive for congestion, ear pain, rhinorrhea and sore throat.   Eyes:  Negative for discharge and redness.  Respiratory:  Positive for cough. Negative for shortness of breath.   Gastrointestinal:  Negative for diarrhea, nausea and vomiting.  Neurological:  Negative for numbness.     Physical Exam Triage Vital Signs ED Triage Vitals  Enc Vitals Group     BP      Pulse      Resp       Temp      Temp src      SpO2      Weight      Height      Head Circumference      Peak Flow      Pain Score      Pain Loc      Pain Edu?      Excl. in GC?    No data found.  Updated Vital Signs BP 129/83 (BP Location: Left Arm)   Pulse (!) 106   Temp 98.3 F (36.8 C) (Oral)   Resp 18   SpO2 95%      Physical Exam Vitals and nursing note reviewed.  Constitutional:      General: He is not in acute distress.    Appearance: Normal appearance. He is not ill-appearing.  HENT:     Head: Normocephalic and atraumatic.     Right Ear: Tympanic membrane normal.     Left Ear: Tympanic membrane normal.     Nose: Congestion present.     Mouth/Throat:     Mouth: Mucous membranes are moist.     Pharynx: Oropharynx is clear. Posterior oropharyngeal erythema present.  Eyes:  Conjunctiva/sclera: Conjunctivae normal.  Cardiovascular:     Rate and Rhythm: Normal rate and regular rhythm.     Heart sounds: Normal heart sounds.  Pulmonary:     Effort: Pulmonary effort is normal. No respiratory distress.     Breath sounds: Normal breath sounds. No wheezing, rhonchi or rales.  Neurological:     Mental Status: He is alert.  Psychiatric:        Mood and Affect: Mood normal.        Behavior: Behavior normal.        Thought Content: Thought content normal.      UC Treatments / Results  Labs (all labs ordered are listed, but only abnormal results are displayed) Labs Reviewed  NOVEL CORONAVIRUS, NAA    EKG   Radiology No results found.  Procedures Procedures (including critical care time)  Medications Ordered in UC Medications - No data to display  Initial Impression / Assessment and Plan / UC Course  I have reviewed the triage vital signs and the nursing notes.  Pertinent labs & imaging results that were available during my care of the patient were reviewed by me and considered in my medical decision making (see chart for details).  Suspect likely viral etiology of  symptoms.  Will order COVID screening and recommended symptomatic treatment and rest.  Encouraged follow-up with any further concerns.   Final Clinical Impressions(s) / UC Diagnoses   Final diagnoses:  Acute upper respiratory infection   Discharge Instructions   None    ED Prescriptions   None    PDMP not reviewed this encounter.   Tomi Bamberger, PA-C 04/19/22 1149

## 2022-04-19 NOTE — ED Triage Notes (Signed)
Pt presents with headache, sore throat, non productive cough, and sore throat X 2 days.

## 2022-04-20 LAB — NOVEL CORONAVIRUS, NAA: SARS-CoV-2, NAA: NOT DETECTED

## 2022-08-02 ENCOUNTER — Encounter: Payer: Self-pay | Admitting: Emergency Medicine

## 2022-08-02 ENCOUNTER — Ambulatory Visit
Admission: EM | Admit: 2022-08-02 | Discharge: 2022-08-02 | Disposition: A | Discharge status: Home / Self Care | Payer: Managed Care, Other (non HMO) | Attending: Internal Medicine | Admitting: Internal Medicine

## 2022-08-02 DIAGNOSIS — Z1152 Encounter for screening for COVID-19: Secondary | ICD-10-CM | POA: Insufficient documentation

## 2022-08-02 DIAGNOSIS — R059 Cough, unspecified: Secondary | ICD-10-CM | POA: Insufficient documentation

## 2022-08-02 DIAGNOSIS — Z79899 Other long term (current) drug therapy: Secondary | ICD-10-CM | POA: Diagnosis not present

## 2022-08-02 DIAGNOSIS — J029 Acute pharyngitis, unspecified: Secondary | ICD-10-CM | POA: Insufficient documentation

## 2022-08-02 DIAGNOSIS — J069 Acute upper respiratory infection, unspecified: Secondary | ICD-10-CM | POA: Diagnosis present

## 2022-08-02 LAB — POCT RAPID STREP A (OFFICE): Rapid Strep A Screen: NEGATIVE

## 2022-08-02 MED ORDER — ACETAMINOPHEN 325 MG PO TABS
650.0000 mg | ORAL_TABLET | Freq: Once | ORAL | Status: AC
  Administered 2022-08-02: 650 mg via ORAL

## 2022-08-02 MED ORDER — FLUTICASONE PROPIONATE 50 MCG/ACT NA SUSP: 1.0000 | Freq: Every day | NASAL | 0 refills | Status: AC

## 2022-08-02 MED ORDER — CHLORASEPTIC 1.4 % MT LIQD: 1.0000 | OROMUCOSAL | 0 refills | Status: AC | PRN

## 2022-08-02 MED ORDER — BENZONATATE 100 MG PO CAPS: 100.0000 mg | ORAL_CAPSULE | Freq: Three times a day (TID) | ORAL | 0 refills | Status: DC | PRN

## 2022-08-02 NOTE — ED Provider Notes (Signed)
[facility]    CSN: [id number] Arrival date & time: 08/02/22  1202      History   Chief Complaint Chief Complaint  Patient presents with   Sore Throat   Cough    congestion    HPI Jonathan Mooney is a 21 y.o. male.   Patient presents with cough, nasal congestion, sore throat that has been present for about 3 days. Family member has similar symptoms.  He denies any known fevers at home.  Denies chest pain, shortness of breath, ear pain, nausea, vomiting, diarrhea, abdominal pain. Does not report taking medications for symptoms.  Reports history of asthma in childhood.    Sore Throat  Cough   Past Medical History:  Diagnosis Date   Obesity     There are no problems to display for this patient.   History reviewed. No pertinent surgical history.     Home Medications    Prior to Admission medications   Medication Sig Start Date End Date Taking? Authorizing Provider  benzonatate (TESSALON) 100 MG capsule Take 1 capsule (100 mg total) by mouth every 8 (eight) hours as needed for cough. 08/02/22  Yes Lissete Maestas, Rolly Salter E, FNP  fluticasone (FLONASE) 50 MCG/ACT nasal spray Place 1 spray into both nostrils daily. 08/02/22  Yes Amiaya Mcneeley, Rolly Salter E, FNP  phenol (CHLORASEPTIC) 1.4 % LIQD Use as directed 1 spray in the mouth or throat as needed for throat irritation / pain. 08/02/22  Yes Rashaud Ybarbo, Acie Fredrickson, FNP  doxycycline (VIBRA-TABS) 100 MG tablet Take 1 tablet (100 mg total) by mouth 2 (two) times daily. 11/21/21   Vivi Barrack, DPM    Family History Family History  Problem Relation Age of Onset   Healthy Mother    Healthy Father     Social History Social History   Tobacco Use   Smoking status: Never   Smokeless tobacco: Never  Vaping Use   Vaping Use: Never used  Substance Use Topics   Alcohol use: No    Comment: socially   Drug use: No     Allergies   Patient has no known allergies.   Review of Systems Review of Systems Per HPI  Physical  Exam Triage Vital Signs ED Triage Vitals [08/02/22 1327]  Enc Vitals Group     BP 128/84     Pulse Rate (!) 116     Resp 18     Temp 98.3 F (36.8 C)     Temp src      SpO2 96 %     Weight      Height      Head Circumference      Peak Flow      Pain Score 2     Pain Loc      Pain Edu?      Excl. in GC?    No data found.  Updated Vital Signs BP 128/84   Pulse 100   Temp 98.3 F (36.8 C)   Resp 18   SpO2 96%   Visual Acuity Right Eye Distance:   Left Eye Distance:   Bilateral Distance:    Right Eye Near:   Left Eye Near:    Bilateral Near:     Physical Exam Constitutional:      General: He is not in acute distress.    Appearance: Normal appearance. He is not toxic-appearing or diaphoretic.  HENT:     Head: Normocephalic and atraumatic.     Right Ear: Tympanic membrane  and ear canal normal.     Left Ear: Tympanic membrane and ear canal normal.     Nose: Congestion present.     Mouth/Throat:     Mouth: Mucous membranes are moist.     Pharynx: Posterior oropharyngeal erythema present. No pharyngeal swelling or oropharyngeal exudate.     Tonsils: No tonsillar exudate or tonsillar abscesses. 1+ on the right. 1+ on the left.  Eyes:     Extraocular Movements: Extraocular movements intact.     Conjunctiva/sclera: Conjunctivae normal.     Pupils: Pupils are equal, round, and reactive to light.  Cardiovascular:     Rate and Rhythm: Normal rate and regular rhythm.     Pulses: Normal pulses.     Heart sounds: Normal heart sounds.  Pulmonary:     Effort: Pulmonary effort is normal. No respiratory distress.     Breath sounds: Normal breath sounds. No wheezing.  Abdominal:     General: Abdomen is flat. Bowel sounds are normal.     Palpations: Abdomen is soft.  Musculoskeletal:        General: Normal range of motion.     Cervical back: Normal range of motion.  Skin:    General: Skin is warm and dry.  Neurological:     General: No focal deficit present.      Mental Status: He is alert and oriented to person, place, and time. Mental status is at baseline.  Psychiatric:        Mood and Affect: Mood normal.        Behavior: Behavior normal.      UC Treatments / Results  Labs (all labs ordered are listed, but only abnormal results are displayed) Labs Reviewed  CULTURE, GROUP A STREP ([facility])  SARS CORONAVIRUS 2 (TAT 6-24 HRS)  POCT RAPID STREP A (OFFICE)    EKG   Radiology No results found.  Procedures Procedures (including critical care time)  Medications Ordered in UC Medications  acetaminophen (TYLENOL) tablet 650 mg (650 mg Oral Given 08/02/22 1356)    Initial Impression / Assessment and Plan / UC Course  I have reviewed the triage vital signs and the nursing notes.  Pertinent labs & imaging results that were available during my care of the patient were reviewed by me and considered in my medical decision making (see chart for details).     Patient presents with symptoms likely from a viral upper respiratory infection. Differential includes bacterial pneumonia, sinusitis, allergic rhinitis, covid 19, flu, RSV Do not suspect underlying cardiopulmonary process. Symptoms seem unlikely related to ACS, CHF or COPD exacerbations, pneumonia, pneumothorax. Patient is nontoxic appearing and not in need of emergent medical intervention.  Rapid strep was negative.  Throat culture and COVID test pending.  Suspect tonsillitis is due to viral illness.  Low concern for strep throat at this time.  Recommended symptom control with medications to treat symptoms.  Patient sent prescriptions.  Had mildly elevated heart rate but suspect this is due to inflammation, viral illness, fever.  Tylenol administered with improvement in heart rate.  Therefore, do not think any further work-up is necessary at this time.  No concern for cardiac etiology.  Return if symptoms fail to improve in 1-2 weeks or you develop shortness of breath, chest pain, severe  headache. Patient states understanding and is agreeable.  Discharged with PCP followup.  Final Clinical Impressions(s) / UC Diagnoses   Final diagnoses:  Viral upper respiratory tract infection with cough  Sore throat  Discharge Instructions      Rapid strep negative.  Throat culture and COVID test pending.  You have a viral upper respiratory infection which should run its course and self resolve with symptomatic treatment.  I have sent you a few medications to alleviate symptoms.  Please follow-up if symptoms persist or worsen.     ED Prescriptions     Medication Sig Dispense Auth. Provider   fluticasone (FLONASE) 50 MCG/ACT nasal spray Place 1 spray into both nostrils daily. 16 g Kentrel Clevenger, Rolly Salter E, [place]   benzonatate (TESSALON) 100 MG capsule Take 1 capsule (100 mg total) by mouth every 8 (eight) hours as needed for cough. 21 capsule [place], [place]ance E, [place]   phenol (CHLORASEPTIC) 1.4 % LIQD Use as directed 1 spray in the mouth or throat as needed for throat irritation / pain. 118 mL Gustavus Bryant, [place]      PDMP not reviewed this encounter.   Gustavus Bryant, [place] 08/02/22 [phone]

## 2022-08-02 NOTE — Discharge Instructions (Signed)
Rapid strep negative.  Throat culture and COVID test pending.  You have a viral upper respiratory infection which should run its course and self resolve with symptomatic treatment.  I have sent you a few medications to alleviate symptoms.  Please follow-up if symptoms persist or worsen.

## 2022-08-02 NOTE — ED Triage Notes (Signed)
Pt is present today with c/o cough, congestion, and sore throat x3 days

## 2022-08-05 LAB — CULTURE, GROUP A STREP (THRC)

## 2022-08-07 LAB — SARS CORONAVIRUS 2 (TAT 6-24 HRS): SARS Coronavirus 2: NEGATIVE

## 2022-09-16 DIAGNOSIS — J069 Acute upper respiratory infection, unspecified: Secondary | ICD-10-CM | POA: Insufficient documentation

## 2022-09-16 DIAGNOSIS — A084 Viral intestinal infection, unspecified: Secondary | ICD-10-CM | POA: Insufficient documentation

## 2022-11-10 ENCOUNTER — Ambulatory Visit
Admission: EM | Admit: 2022-11-10 | Discharge: 2022-11-10 | Disposition: A | Discharge status: Home / Self Care | Payer: Medicaid Other

## 2022-11-10 DIAGNOSIS — I10 Essential (primary) hypertension: Secondary | ICD-10-CM

## 2022-11-10 HISTORY — DX: Essential (primary) hypertension: I10

## 2022-11-10 MED ORDER — CHLORTHALIDONE 25 MG PO TABS: 25.0000 mg | ORAL_TABLET | Freq: Every day | ORAL | 1 refills | Status: AC

## 2022-11-10 NOTE — ED Provider Notes (Signed)
[facility]    CSN: [id number] Arrival date & time: 11/10/22  1017      History   Chief Complaint Chief Complaint  Patient presents with   Medication Refill    HPI Jonathan Mooney is a 22 y.o. male.   22 year old presents for medication renewal.  Patient indicates that he has been out of his blood pressure medicine for 2 days.  Patient indicates for the past year and a half he has been taking chlorthalidone 25 mg once daily.  Patient indicates that he has been doing well on blood pressure medicines without any problems or side effects.  Patient indicates that he has hypertension is due to family history and also weight related.  He indicates that he has working on becoming established with a new PCP in order to have his medications regulated and renewed on a regular basis.   Medication Refill   Past Medical History:  Diagnosis Date   Hypertension    Obesity     There are no problems to display for this patient.   History reviewed. No pertinent surgical history.     Home Medications    Prior to Admission medications   Medication Sig Start Date End Date Taking? Authorizing Provider  benzonatate (TESSALON) 100 MG capsule Take 1 capsule (100 mg total) by mouth every 8 (eight) hours as needed for cough. 08/02/22   Teodora Medici, FNP  chlorthalidone (HYGROTON) 25 MG tablet Take 1 tablet (25 mg total) by mouth daily. 11/10/22   Nyoka Lint, PA-C  doxycycline (VIBRA-TABS) 100 MG tablet Take 1 tablet (100 mg total) by mouth 2 (two) times daily. 11/21/21   Trula Slade, DPM  fluticasone (FLONASE) 50 MCG/ACT nasal spray Place 1 spray into both nostrils daily. 08/02/22   [place], Michele Rockers, FNP  phenol (CHLORASEPTIC) 1.4 % LIQD Use as directed 1 spray in the mouth or throat as needed for throat irritation / pain. 08/02/22   Teodora Medici, FNP    Family History Family History  Problem Relation Age of Onset   Healthy Mother    Healthy Father     Social  History Social History   Tobacco Use   Smoking status: Never   Smokeless tobacco: Never  Vaping Use   Vaping Use: Never used  Substance Use Topics   Alcohol use: No    Comment: socially   Drug use: No     Allergies   Patient has no known allergies.   Review of Systems Review of Systems   Physical Exam Triage Vital Signs ED Triage Vitals  Enc Vitals Group     BP 11/10/22 1141 135/89     Pulse Rate 11/10/22 1141 78     Resp 11/10/22 1141 18     Temp 11/10/22 1141 98 F (36.7 C)     Temp Source 11/10/22 1141 Oral     SpO2 11/10/22 1141 96 %     Weight --      Height --      Head Circumference --      Peak Flow --      Pain Score 11/10/22 1137 0     Pain Loc --      Pain Edu? --      Excl. in [place]? --    No data found.  Updated Vital Signs BP 135/89 (BP Location: Right Arm)   Pulse 78   Temp 98 F (36.7 C) (Oral)   Resp 18   SpO2  96%   Visual Acuity Right Eye Distance:   Left Eye Distance:   Bilateral Distance:    Right Eye Near:   Left Eye Near:    Bilateral Near:     Physical Exam Constitutional:      Appearance: Normal appearance.  Cardiovascular:     Rate and Rhythm: Normal rate and regular rhythm.     Heart sounds: Normal heart sounds.  Pulmonary:     Effort: Pulmonary effort is normal.     Breath sounds: Normal air entry. No wheezing, rhonchi or rales.  Neurological:     Mental Status: He is alert.      UC Treatments / Results  Labs (all labs ordered are listed, but only abnormal results are displayed) Labs Reviewed - No data to display  EKG   Radiology No results found.  Procedures Procedures (including critical care time)  Medications Ordered in UC Medications - No data to display  Initial Impression / Assessment and Plan / UC Course  I have reviewed the triage vital signs and the nursing notes.  Pertinent labs & imaging results that were available during my care of the patient were reviewed by me and considered in my  medical decision making (see chart for details).    Plan: The diagnosis will be treated with the following: 1.  Essential hypertension: A.  Continue chlorthalidone 25 mg once daily for blood pressure control. 2.  Advised to follow-up become established with PCP for further monitoring and adjustments. 3.  Return to urgent care as needed. Final Clinical Impressions(s) / UC Diagnoses   Final diagnoses:  Essential hypertension     Discharge Instructions      Advised to take the diuretic daily on a regular basis. Advised follow-up PCP or return to urgent care if symptoms fail to improve.    ED Prescriptions     Medication Sig Dispense Auth. Provider   chlorthalidone (HYGROTON) 25 MG tablet Take 1 tablet (25 mg total) by mouth daily. 90 tablet Nyoka Lint, PA-C      PDMP not reviewed this encounter.   Nyoka Lint, PA-C 11/10/22 1202

## 2022-11-10 NOTE — Discharge Instructions (Signed)
Advised to take the diuretic daily on a regular basis. Advised follow-up PCP or return to urgent care if symptoms fail to improve.

## 2022-11-10 NOTE — ED Triage Notes (Signed)
Pt presents with medication refill of blood pressure medication.

## 2023-03-13 ENCOUNTER — Ambulatory Visit
Admission: EM | Admit: 2023-03-13 | Discharge: 2023-03-13 | Disposition: A | Discharge status: Home / Self Care | Payer: Medicaid Other

## 2023-03-13 DIAGNOSIS — I10 Essential (primary) hypertension: Secondary | ICD-10-CM

## 2023-03-13 DIAGNOSIS — E119 Type 2 diabetes mellitus without complications: Secondary | ICD-10-CM

## 2023-03-13 MED ORDER — METFORMIN HCL 500 MG PO TABS: 500.0000 mg | ORAL_TABLET | Freq: Two times a day (BID) | ORAL | 0 refills | Status: AC

## 2023-03-13 NOTE — ED Notes (Signed)
Unsuccessful blood draw x2, provider made aware. Patient advised to ensure he schedules appt with PCP.

## 2023-03-13 NOTE — Discharge Instructions (Signed)
Metformin has been refilled.  Continue medications.  Blood work is pending.  Will call if it is abnormal.  Please schedule an appointment with your primary care doctor as soon as possible.

## 2023-03-13 NOTE — ED Provider Notes (Addendum)
[facility]    CSN: [id number] Arrival date & time: 03/13/23  1152      History   Chief Complaint Chief Complaint  Patient presents with   Medication Refill    HPI Jonathan Mooney is a 22 y.o. male.   Patient presents today for medication refill for metformin and chlorthalidone.  He states that he just ran out the metformin today.  He states that he has 1 to 2 months left of chlorthalidone.  He states that he has been taking these medications for 3 to 4 years and tolerating well.  He routinely checks his blood pressure at home and it is typically 130 systolic while on the medication.  He states that he does not routinely check his blood sugar.  He last saw PCP a few months prior.  Had chlorthalidone refilled in May for 90-day supply.  Patient is not reporting any chest pain, shortness of breath, headache, dizziness, blurred vision, nausea, vomiting.   Medication Refill   Past Medical History:  Diagnosis Date   Hypertension    Obesity     There are no problems to display for this patient.   History reviewed. No pertinent surgical history.     Home Medications    Prior to Admission medications   Medication Sig Start Date End Date Taking? Authorizing Provider  benzonatate (TESSALON) 100 MG capsule Take 1 capsule (100 mg total) by mouth every 8 (eight) hours as needed for cough. 08/02/22   Gustavus Bryant, FNP  chlorthalidone (HYGROTON) 25 MG tablet Take 1 tablet (25 mg total) by mouth daily. 11/10/22   Ellsworth Lennox, PA-C  doxycycline (VIBRA-TABS) 100 MG tablet Take 1 tablet (100 mg total) by mouth 2 (two) times daily. 11/21/21   Vivi Barrack, DPM  fluticasone (FLONASE) 50 MCG/ACT nasal spray Place 1 spray into both nostrils daily. 08/02/22   Gustavus Bryant, FNP  metFORMIN (GLUCOPHAGE) 500 MG tablet Take 1 tablet (500 mg total) by mouth 2 (two) times daily. 03/13/23 04/12/23  Gustavus Bryant, FNP  phenol (CHLORASEPTIC) 1.4 % LIQD Use as directed 1 spray in  the mouth or throat as needed for throat irritation / pain. 08/02/22   Gustavus Bryant, FNP    Family History Family History  Problem Relation Age of Onset   Healthy Mother    Healthy Father     Social History Social History   Tobacco Use   Smoking status: Never   Smokeless tobacco: Never  Vaping Use   Vaping Use: Never used  Substance Use Topics   Alcohol use: No    Comment: socially   Drug use: No     Allergies   Patient has no known allergies.   Review of Systems Review of Systems Per HPI  Physical Exam Triage Vital Signs ED Triage Vitals  Enc Vitals Group     BP 03/13/23 1304 (!) 140/91     Pulse Rate 03/13/23 1304 89     Resp 03/13/23 1304 20     Temp 03/13/23 1304 98 F (36.7 C)     Temp Source 03/13/23 1304 Oral     SpO2 03/13/23 1304 95 %     Weight --      Height --      Head Circumference --      Peak Flow --      Pain Score 03/13/23 1325 0     Pain Loc --      Pain Edu? --  Excl. in GC? --    No data found.  Updated Vital Signs BP (!) 140/91 (BP Location: Left Arm)   Pulse 89   Temp 98 F (36.7 C) (Oral)   Resp 20   SpO2 95%   Visual Acuity Right Eye Distance:   Left Eye Distance:   Bilateral Distance:    Right Eye Near:   Left Eye Near:    Bilateral Near:     Physical Exam Constitutional:      General: He is not in acute distress.    Appearance: Normal appearance. He is not toxic-appearing or diaphoretic.  HENT:     Head: Normocephalic and atraumatic.  Eyes:     Extraocular Movements: Extraocular movements intact.     Conjunctiva/sclera: Conjunctivae normal.  Cardiovascular:     Rate and Rhythm: Normal rate and regular rhythm.     Pulses: Normal pulses.     Heart sounds: Normal heart sounds.  Pulmonary:     Effort: Pulmonary effort is normal. No respiratory distress.     Breath sounds: Normal breath sounds.  Neurological:     General: No focal deficit present.     Mental Status: He is alert and oriented to  person, place, and time. Mental status is at baseline.  Psychiatric:        Mood and Affect: Mood normal.        Behavior: Behavior normal.        Thought Content: Thought content normal.        Judgment: Judgment normal.      UC Treatments / Results  Labs (all labs ordered are listed, but only abnormal results are displayed) Labs Reviewed  CBC  BASIC METABOLIC PANEL  HEMOGLOBIN A1C    EKG   Radiology No results found.  Procedures Procedures (including critical care time)  Medications Ordered in UC Medications - No data to display  Initial Impression / Assessment and Plan / UC Course  I have reviewed the triage vital signs and the nursing notes.  Pertinent labs & imaging results that were available during my care of the patient were reviewed by me and considered in my medical decision making (see chart for details).     Will refill metformin at a dose previously prescribed and per patient report.  Patient reports that he has 1 to 27-month supply chlorthalidone so will defer refilling this.  Last refill for chlorthalidone is noted on 02/05/23 for a 90 day supply. Discussed with patient doing basic blood work and hemoglobin A1c as this has not been completed in quite some time. Clinical staff unable to obtain blood work. Advised strict follow-up with PCP as soon as possible for followup and to have blood work completed.  He states that he has regular PCP and will schedule appointment with them.  Patient verbalized understanding and was agreeable with plan. Final Clinical Impressions(s) / UC Diagnoses   Final diagnoses:  Essential hypertension  Type 2 diabetes mellitus without complication, without long-term current use of insulin (HCC)     Discharge Instructions      Metformin has been refilled.  Continue medications.  Blood work is pending.  Will call if it is abnormal.  Please schedule an appointment with your primary care doctor as soon as possible.    ED  Prescriptions     Medication Sig Dispense Auth. Provider   metFORMIN (GLUCOPHAGE) 500 MG tablet Take 1 tablet (500 mg total) by mouth 2 (two) times daily. 60 tablet [place], [place] E,  FNP      PDMP not reviewed this encounter.   Gustavus Bryant, [place] 03/13/23 1404    Gustavus Bryant, [place] 03/13/23 1410

## 2023-03-13 NOTE — ED Triage Notes (Signed)
Patient requesting refill on metformin and chlorthalidone.

## 2023-04-01 ENCOUNTER — Ambulatory Visit (INDEPENDENT_AMBULATORY_CARE_PROVIDER_SITE_OTHER): Payer: Medicaid Other | Admitting: Podiatry

## 2023-04-01 DIAGNOSIS — L600 Ingrowing nail: Secondary | ICD-10-CM

## 2023-04-01 MED ORDER — MUPIROCIN 2 % EX OINT: 1.0000 | TOPICAL_OINTMENT | Freq: Two times a day (BID) | CUTANEOUS | 2 refills | Status: AC

## 2023-04-01 NOTE — Progress Notes (Unsigned)
Subjective: Chief Complaint  Patient presents with   Diabetes    Patient came in today for left foot  3rd toe, ingrown toenail,    22 year old male presents the office with above concerns.  He is not sure if left fifth toe become ingrown or not but has noticed some bleeding around the nail.  No drainage or pus otherwise.  No swelling.  No injuries.  No treatment.  No other concerns.  Objective: AAO x3, NAD DP/PT pulses palpable bilaterally, CRT less than 3 seconds Mild incurvation present right second medial nail border as well as the left third and there is some dried blood in the nail border.  There is mild incurvation of the nail border.  There is no edema, erythema or signs of infection.  No drainage or pus.  No open lesions. No pain with calf compression, swelling, warmth, erythema  Assessment: Ingrown toenails bilateral  Plan: -All treatment options discussed with the patient including all alternatives, risks, complications.  -We discussed partial nail avulsion but he wanted to hold off on this today.  Sharp debridement nails with pain complications.  Recommend Epsom salt soaks, antibiotic ointment dressing changes daily.  If symptoms continue or progress may recommend partial nail avulsions. -Patient encouraged to call the office with any questions, concerns, change in symptoms.   Return if symptoms worsen or fail to improve.  Vivi Barrack DPM

## 2023-04-01 NOTE — Patient Instructions (Signed)

## 2023-04-08 DIAGNOSIS — K121 Other forms of stomatitis: Secondary | ICD-10-CM | POA: Insufficient documentation

## 2023-06-09 DIAGNOSIS — F801 Expressive language disorder: Secondary | ICD-10-CM | POA: Insufficient documentation

## 2023-06-09 DIAGNOSIS — R7303 Prediabetes: Secondary | ICD-10-CM | POA: Insufficient documentation

## 2023-06-09 DIAGNOSIS — E8881 Metabolic syndrome: Secondary | ICD-10-CM | POA: Insufficient documentation

## 2023-12-22 DIAGNOSIS — M25511 Pain in right shoulder: Secondary | ICD-10-CM | POA: Insufficient documentation

## 2024-07-04 DIAGNOSIS — E559 Vitamin D deficiency, unspecified: Secondary | ICD-10-CM | POA: Insufficient documentation

## 2024-07-14 ENCOUNTER — Encounter: Payer: Self-pay | Admitting: Emergency Medicine

## 2024-07-14 ENCOUNTER — Ambulatory Visit: Admission: EM | Admit: 2024-07-14 | Discharge: 2024-07-14 | Disposition: A | Discharge status: Home / Self Care

## 2024-07-14 DIAGNOSIS — J101 Influenza due to other identified influenza virus with other respiratory manifestations: Secondary | ICD-10-CM

## 2024-07-14 LAB — POC COVID19/FLU A&B COMBO
Covid Antigen, POC: NEGATIVE
Influenza A Antigen, POC: POSITIVE — AB
Influenza B Antigen, POC: POSITIVE — AB

## 2024-07-14 MED ORDER — OSELTAMIVIR PHOSPHATE 75 MG PO CAPS: 75.0000 mg | ORAL_CAPSULE | Freq: Two times a day (BID) | ORAL | 0 refills | Status: AC

## 2024-07-14 NOTE — Discharge Instructions (Signed)

## 2024-07-14 NOTE — ED Triage Notes (Addendum)
 Pt presents c/o sore throat and nasal congestion x 2 days. Pt states,  I have a sore throat in the mornings when I wake up and a stuffy nose. Also, I have no appetite. My body also aches a little.  Pt denies emesis and diarrhea.

## 2024-07-14 NOTE — ED Provider Notes (Signed)
 [facility]    CSN: [id number] Arrival date & time: 07/14/24  1132      History   Chief Complaint Chief Complaint  Patient presents with   Sore Throat   Nasal Congestion   Generalized Body Aches    HPI Jonathan Mooney is a 23 y.o. male.   Patient presents today due to 2 days of throat pain, nasal congestion, subjective fever, and bodyaches.  Patient denies known sick contacts, nausea, vomiting, or change in appetite.  Patient states he has been taking over-the-counter cold medicine since yesterday which he believes is helping with symptoms.  The history is provided by the patient.  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 No pertinent surgical history.     Home Medications    Prior to Admission medications   Medication Sig Start Date End Date Taking? Authorizing Provider  liraglutide (VICTOZA) 18 MG/3ML SOPN Inject 0.6mg  SQ daily x 1 week, then increase to 1.2mg  SQ daily x 1 week, then continue with 1.6mg  SQ daily 06/06/21  Yes [provider]  oseltamivir (TAMIFLU) 75 MG capsule Take 1 capsule (75 mg total) by mouth every 12 (twelve) hours. 07/14/24  Yes Andra Krabbe C, PA-C  Vitamin D, Ergocalciferol, (DRISDOL) 1.25 MG (50000 UNIT) CAPS capsule Take 50,000 Units by mouth once a week. 07/04/24  Yes [provider]  benzonatate  (TESSALON ) 100 MG capsule Take 1 capsule (100 mg total) by mouth every 8 (eight) hours as needed for cough. 08/02/22   Hazen Darryle BRAVO, FNP  chlorthalidone  (HYGROTON ) 25 MG tablet Take 1 tablet (25 mg total) by mouth daily. 11/10/22   Lynwood Lenis, PA-C  diclofenac Sodium (VOLTAREN) 1 % GEL Apply by topical route for 25 days.    [provider]  doxycycline  (VIBRA -TABS) 100 MG tablet Take 1 tablet (100 mg total) by mouth 2 (two) times daily. 11/21/21   Gershon Donnice SAUNDERS, DPM  fluticasone  (FLONASE ) 50 MCG/ACT nasal spray Place 1 spray into both nostrils daily. 08/02/22   Hazen Darryle BRAVO, FNP  loperamide (IMODIUM A-D) 2 MG tablet TAKE 1 TABLET TWICE A DAY BY ORAL ROUTE AS NEEDED FOR 7 DAYS, FOR DIARRHEA.    [provider]  meloxicam (MOBIC) 15 MG tablet take 1 tab po qd with food for inflammation/pain of foot    [provider]  metFORMIN  (GLUCOPHAGE ) 500 MG tablet Take 1 tablet (500 mg total) by mouth 2 (two) times daily. 03/13/23 04/12/23  Hazen Darryle BRAVO, FNP  mupirocin  ointment (BACTROBAN ) 2 % Apply 1 Application topically 2 (two) times daily. 04/01/23   Gershon Donnice SAUNDERS, DPM  ondansetron (ZOFRAN) 4 MG tablet Take 1 tablet twice a day by oral route as needed for 7 days, for nausea.    [provider]  phenol (CHLORASEPTIC) 1.4 % LIQD Use as directed 1 spray in  the mouth or throat as needed for throat irritation / pain. 08/02/22   Hazen Darryle BRAVO, FNP  promethazine-dextromethorphan (PROMETHAZINE-DM) 6.25-15 MG/5ML syrup TAKE BY MOUTH EVERY 4 HOURS AS NEEDED    [provider]  tirzepatide (MOUNJARO) 2.5 MG/0.5ML Pen Inject by subcutaneous route for 28 days.    [provider]    Family History Family History  Problem Relation Age of Onset   Healthy Mother    Healthy Father     Social History Social History   Tobacco Use   Smoking status: Never    Passive exposure: Never   Smokeless tobacco: Never  Vaping  Use   Vaping status: Never Used  Substance Use Topics   Alcohol use: No    Comment: socially   Drug use: No     Allergies   Patient has no known allergies.   Review of Systems Review of Systems   Physical Exam Triage Vital Signs ED Triage Vitals  Encounter Vitals Group     BP 07/14/24 1235 119/86     Girls Systolic BP Percentile --      Girls Diastolic BP Percentile --      Boys Systolic BP Percentile --      Boys Diastolic BP Percentile --      Pulse Rate 07/14/24 1235 91     Resp 07/14/24 1235 20     Temp 07/14/24 1235 98.2 F (36.8 C)     Temp Source 07/14/24 1235 Oral     SpO2 07/14/24 1235 97 %     Weight --      Height --      Head Circumference --      Peak Flow --      Pain Score 07/14/24 1234 0     Pain Loc --      Pain Education --      Exclude from Growth Chart --    No data found.  Updated Vital Signs BP 119/86 (BP Location: Left Arm)   Pulse 91   Temp 98.2 F (36.8 C) (Oral)   Resp 20   SpO2 97%   Visual Acuity Right Eye Distance:   Left Eye Distance:   Bilateral Distance:    Right Eye Near:   Left Eye Near:    Bilateral Near:     Physical Exam Vitals and nursing note reviewed.  Constitutional:      General: He is not in acute distress.    Appearance: Normal appearance. He is not ill-appearing, toxic-appearing or diaphoretic.  HENT:     Nose: Congestion (moderately enlarged turbinates) present. No rhinorrhea.     Mouth/Throat:     Mouth: Mucous membranes are moist.     Pharynx: Oropharynx is clear. Posterior oropharyngeal erythema (moderate) present. No oropharyngeal exudate.  Eyes:     General: No scleral icterus. Cardiovascular:     Rate and Rhythm: Normal rate and regular rhythm.     Heart sounds: Normal heart sounds.  Pulmonary:     Effort: Pulmonary effort is normal. No respiratory distress.     Breath sounds: Normal breath sounds. No wheezing or rhonchi.  Skin:    General: Skin is warm.  Neurological:     Mental  Status: He is alert and oriented to person, place, and time.  Psychiatric:        Mood and Affect: Mood normal.        Behavior: Behavior normal.      UC Treatments / Results  Labs (all labs ordered are listed, but only abnormal results are displayed) Labs Reviewed  POC COVID19/FLU A&B COMBO - Abnormal; Notable for the following components:      Result Value   Influenza A Antigen, POC Positive (*)    Influenza B Antigen, POC Positive (*)    All other components within normal limits    EKG   Radiology No results found.  Procedures Procedures (including critical care time)  Medications Ordered in UC Medications - No data to display  Initial Impression / Assessment and Plan / UC Course  I have reviewed the triage vital signs and the nursing notes.  Pertinent labs & imaging results that were available during my care of the patient were reviewed by me and considered in my medical decision making (see chart for details).     Patient does a positive for flu A and flu B.  Will prescribe Tamiflu and recommend supportive treatments for symptoms. Final Clinical Impressions(s) / UC Diagnoses   Final diagnoses:  Influenza A  Influenza B     Discharge Instructions      You been diagnosed with a viral illness today. -Viruses have to run their course and medicines that are prescribed are meant to help with symptoms. - With viruses usually feel poorly from 3 to 7 days with cough being the last symptoms to resolve.  -Cough can linger from days to weeks.  Antibiotics are not effective for viruses. -If your cough lasts more than 2 weeks and you are coughing so hard that you are vomiting or feel like you could pass out we need to follow-up with PCP for further testing and evaluation. -Rest, increase water intake, may use pseudoephedrine for nasal congestion, Delsym (dextromethorphan) or honey as needed for cough, and ibuprofen and/or Tylenol  as directed on packaging for pain and  fever. -If you have hypertension you should take Coricidin or other OTC meds approved for people with high blood pressure. -You may use a spoonful of honey every 4-6 hours as needed for throat pain and cough. -Warm tea with honey and lemon are helpful for soothe throat as well.  Chloraseptic and Cepacol make a throat lozenge with numbing medication, can be purchased over-the-counter. -May also use Flonase  or sinus rinse for sinus pressure or nasal congestion.  Be sure to use distilled bottled water for sinus rinses. -May use coolmist humidifier to open up nasal passages -May elevate head to assist with postnasal drainage. -If you feel poorly (fever, fatigue, shortness of breath, nausea, etc.) for more than 10 days to be sure to follow-up with PCP or in clinic for further evaluation and additional treatments. If you experience chest pain with shortness of breath or pulse oxygen less than 95% you should report to the ER.      ED Prescriptions     Medication Sig Dispense Auth. Provider   oseltamivir (TAMIFLU) 75 MG capsule Take 1 capsule (75 mg total) by mouth every 12 (twelve) hours. 10 capsule Andra Corean BROCKS, PA-C      PDMP not reviewed this encounter.   Andra Corean BROCKS, PA-C 07/14/24 1304

## 2024-07-17 ENCOUNTER — Encounter: Payer: Self-pay | Admitting: Emergency Medicine

## 2024-07-17 ENCOUNTER — Ambulatory Visit: Admission: EM | Admit: 2024-07-17 | Discharge: 2024-07-17 | Disposition: A | Discharge status: Home / Self Care

## 2024-07-17 DIAGNOSIS — J101 Influenza due to other identified influenza virus with other respiratory manifestations: Secondary | ICD-10-CM

## 2024-07-17 MED ORDER — AZELASTINE HCL 0.1 % NA SOLN: 1.0000 | Freq: Two times a day (BID) | NASAL | 1 refills | Status: AC

## 2024-07-17 MED ORDER — PROMETHAZINE-DM 6.25-15 MG/5ML PO SYRP: 10.0000 mL | ORAL_SOLUTION | Freq: Three times a day (TID) | ORAL | 0 refills | Status: AC | PRN

## 2024-07-17 NOTE — ED Provider Notes (Signed)
 [facility]  Note:  This document was prepared using Conservation officer, historic buildings and may include unintentional dictation errors.  MRN: [id number] DOB: 2001-07-02  Subjective:   Jonathan Mooney is a 23 y.o. male presenting for reevaluation after diagnosis of influenza on Friday, 07/14/2024.  Patient reports that his symptoms have improved somewhat with Tamiflu but still has significant cough and fatigue.  Patient is using cough drops with some improvement.  Patient needs note to be off of work for an extended period of time due to continued symptoms and need for recovery.  No shortness of breath, chest pain, weakness, dizziness  No current facility-administered medications for this encounter.  Current Outpatient Medications:    azelastine (ASTELIN) 0.1 % nasal spray, Place 1 spray into both nostrils 2 (two) times daily. Use in each nostril as directed, Disp: 30 mL, Rfl: 1   chlorthalidone  (HYGROTON ) 25 MG tablet, Take 1 tablet (25 mg total) by mouth daily., Disp: 90 tablet, Rfl: 1   oseltamivir (TAMIFLU) 75 MG capsule, Take 1 capsule (75 mg total) by mouth every 12 (twelve) hours., Disp: 10 capsule, Rfl: 0   promethazine-dextromethorphan (PROMETHAZINE-DM) 6.25-15 MG/5ML syrup, Take 10 mLs by mouth 3 (three) times daily as needed., Disp: 240 mL, Rfl: 0   diclofenac Sodium (VOLTAREN) 1 % GEL, Apply by topical route for 25 days. (Patient not taking: Reported on 07/17/2024), Disp: , Rfl:    doxycycline  (VIBRA -TABS) 100 MG tablet, Take 1 tablet (100 mg total) by mouth 2 (two) times daily. (Patient not taking: Reported on 07/17/2024), Disp: 14 tablet, Rfl: 0   fluticasone  (FLONASE ) 50 MCG/ACT nasal spray, Place 1 spray into both nostrils daily. (Patient not taking: Reported on 07/17/2024), Disp: 16 g, Rfl: 0   liraglutide (VICTOZA) 18 MG/3ML SOPN, Inject 0.6mg  SQ daily x 1 week, then increase to 1.2mg  SQ daily x 1 week, then continue with 1.6mg  SQ daily (Patient not taking: Reported on  07/17/2024), Disp: , Rfl:    loperamide (IMODIUM A-D) 2 MG tablet, TAKE 1 TABLET TWICE A DAY BY ORAL ROUTE AS NEEDED FOR 7 DAYS, FOR DIARRHEA. (Patient not taking: Reported on 07/17/2024), Disp: , Rfl:    meloxicam (MOBIC) 15 MG tablet, take 1 tab po qd with food for inflammation/pain of foot (Patient not taking: Reported on 07/17/2024), Disp: , Rfl:    metFORMIN  (GLUCOPHAGE ) 500 MG tablet, Take 1 tablet (500 mg total) by mouth 2 (two) times daily. (Patient not taking: Reported on 07/17/2024), Disp: 60 tablet, Rfl: 0   mupirocin  ointment (BACTROBAN ) 2 %, Apply 1 Application topically 2 (two) times daily. (Patient not taking: Reported on 07/17/2024), Disp: 30 g, Rfl: 2   ondansetron (ZOFRAN) 4 MG tablet, Take 1 tablet twice a day by oral route as needed for 7 days, for nausea. (Patient not taking: Reported on 07/17/2024), Disp: , Rfl:    phenol (CHLORASEPTIC) 1.4 % LIQD, Use as directed 1 spray in the mouth or throat as needed for throat irritation / pain. (Patient not taking: Reported on 07/17/2024), Disp: 118 mL, Rfl: 0   tirzepatide (MOUNJARO) 2.5 MG/0.5ML Pen, Inject by subcutaneous route for 28 days. (Patient not taking: Reported on 07/17/2024), Disp: , Rfl:    Vitamin D, Ergocalciferol, (DRISDOL) 1.25 MG (50000 UNIT) CAPS capsule, Take 50,000 Units by mouth once a week. (Patient not taking: Reported on 07/17/2024), Disp: , Rfl:    No Known Allergies  Past Medical History:  Diagnosis Date   Hypertension    Obesity  History reviewed. No pertinent surgical history.  Family History  Problem Relation Age of Onset   Healthy Mother    Healthy Father     Social History   Tobacco Use   Smoking status: Never    Passive exposure: Never   Smokeless tobacco: Never  Vaping Use   Vaping status: Never Used  Substance Use Topics   Alcohol use: No    Comment: socially   Drug use: No    ROS Refer to HPI for ROS details.  Objective:   Vitals: BP 131/73 (BP Location: Left Arm)   Pulse (!)  113   Temp 98.3 F (36.8 C) (Oral)   Resp 18   SpO2 97%   Physical Exam Vitals and nursing note reviewed.  Constitutional:      General: He is not in acute distress.    Appearance: Normal appearance. He is well-developed. He is not ill-appearing or toxic-appearing.  HENT:     Head: Normocephalic.     Nose: Congestion present.     Mouth/Throat:     Mouth: Mucous membranes are moist.     Pharynx: Oropharynx is clear.  Cardiovascular:     Rate and Rhythm: Normal rate.  Pulmonary:     Effort: Pulmonary effort is normal. No respiratory distress.     Breath sounds: No stridor. No wheezing.  Skin:    General: Skin is warm and dry.  Neurological:     General: No focal deficit present.     Mental Status: He is alert and oriented to person, place, and time.  Psychiatric:        Mood and Affect: Mood normal.        Behavior: Behavior normal.     Procedures  No results found for this or any previous visit (from the past 24 hours).  No results found.   Assessment and Plan :     Discharge Instructions       1. Influenza A (Primary) - azelastine (ASTELIN) 0.1 % nasal spray; Place 1 spray into both nostrils 2 (two) times daily. Use in each nostril as directed  Dispense: 30 mL; Refill: 1 - promethazine-dextromethorphan (PROMETHAZINE-DM) 6.25-15 MG/5ML syrup; Take 10 mLs by mouth 3 (three) times daily as needed.  Dispense: 240 mL; Refill: 0 -Continue to monitor symptoms for any change in severity if there is any escalation of current symptoms or development of new symptoms follow-up in ER for further evaluation and management.       Samar Dass B Audreyana Huntsberry   Jatoria Kneeland, [place] B, [place] 07/17/24 1824

## 2024-07-17 NOTE — Discharge Instructions (Addendum)
  1. Influenza A (Primary) - azelastine (ASTELIN) 0.1 % nasal spray; Place 1 spray into both nostrils 2 (two) times daily. Use in each nostril as directed  Dispense: 30 mL; Refill: 1 - promethazine-dextromethorphan (PROMETHAZINE-DM) 6.25-15 MG/5ML syrup; Take 10 mLs by mouth 3 (three) times daily as needed.  Dispense: 240 mL; Refill: 0 -Continue to monitor symptoms for any change in severity if there is any escalation of current symptoms or development of new symptoms follow-up in ER for further evaluation and management.

## 2024-07-17 NOTE — ED Triage Notes (Signed)
 Pt tested positive for flu on Friday, 10/31. Some improvement with tamiflu. New cough that started yesterday - has started using cough drops. Needs work note for additional time off to recover.

## 2024-10-19 ENCOUNTER — Encounter: Payer: Self-pay | Admitting: Podiatry

## 2024-10-19 ENCOUNTER — Ambulatory Visit: Admitting: Podiatry

## 2024-10-19 DIAGNOSIS — L600 Ingrowing nail: Secondary | ICD-10-CM

## 2024-10-19 MED ORDER — CEPHALEXIN 500 MG PO CAPS: 500.0000 mg | ORAL_CAPSULE | Freq: Three times a day (TID) | ORAL | 0 refills | Status: AC

## 2024-10-19 NOTE — Patient Instructions (Signed)

## 2024-11-03 ENCOUNTER — Ambulatory Visit: Admitting: Podiatry
